# Patient Record
Sex: Female | Born: 1985 | Race: White | Hispanic: No | Marital: Married | Smoking: Never smoker
Health system: Southern US, Community
[De-identification: ages and names within clinical notes are randomized; demographics above are authoritative.]

## PROBLEM LIST (undated history)

## (undated) DIAGNOSIS — R198 Other specified symptoms and signs involving the digestive system and abdomen: Secondary | ICD-10-CM

## (undated) DIAGNOSIS — O24419 Gestational diabetes mellitus in pregnancy, unspecified control: Secondary | ICD-10-CM

## (undated) HISTORY — DX: Gestational diabetes mellitus in pregnancy, unspecified control: O24.419

## (undated) HISTORY — PX: OTHER SURGICAL HISTORY: SHX169

## (undated) HISTORY — DX: Other specified symptoms and signs involving the digestive system and abdomen: R19.8

## (undated) HISTORY — PX: WISDOM TOOTH EXTRACTION: SHX21

---

## 2015-03-12 ENCOUNTER — Encounter: Payer: BLUE CROSS/BLUE SHIELD | Attending: Obstetrics and Gynecology

## 2015-03-12 VITALS — Ht 65.0 in | Wt 169.8 lb

## 2015-03-12 DIAGNOSIS — O24419 Gestational diabetes mellitus in pregnancy, unspecified control: Secondary | ICD-10-CM | POA: Insufficient documentation

## 2015-03-12 DIAGNOSIS — Z713 Dietary counseling and surveillance: Secondary | ICD-10-CM | POA: Insufficient documentation

## 2015-03-18 NOTE — Progress Notes (Signed)
  Patient was seen on 02/09/15 for Gestational Diabetes self-management . The following learning objectives were met by the patient :   States the definition of Gestational Diabetes  States why dietary management is important in controlling blood glucose  Describes the effects of carbohydrates on blood glucose levels  Demonstrates ability to create a balanced meal plan  Demonstrates carbohydrate counting   States when to check blood glucose levels  Demonstrates proper blood glucose monitoring techniques  States the effect of stress and exercise on blood glucose levels  States the importance of limiting caffeine and abstaining from alcohol and smoking  Plan:  Aim for 2 Carb Choices per meal (30 grams) +/- 1 either way for breakfast Aim for 3 Carb Choices per meal (45 grams) +/- 1 either way from lunch and dinner Aim for 1-2 Carbs per snack Begin reading food labels for Total Carbohydrate and sugar grams of foods Consider  increasing your activity level by walking daily as tolerated Begin checking BG before breakfast and 1-2 hours after first bit of breakfast, lunch and dinner after  as directed by MD  Take medication  as directed by MD  Blood glucose monitor given: One Touch Verio Flex Lot # [id number] X Exp: 01/2016  Patient instructed to monitor glucose levels: FBS: 60 - <90 1 hour: <140 2 hour: <120  Patient received the following handouts:  Nutrition Diabetes and Pregnancy  Carbohydrate Counting List  Meal Planning worksheet  Patient will be seen for follow-up as needed.

## 2015-04-21 NOTE — H&P (Signed)
Charlotte Hodge  DICTATION # [id number] CSN# [id number]   Meriel PicaHOLLAND,Charlotte Yonker M, MD 04/21/2015 3:14 PM

## 2015-04-30 NOTE — H&P (Signed)
NAME:  Charlotte Hodge, Charlotte Hodge                   ACCOUNT NO.:  MEDICAL RECORD NO.:  [id number]  LOCATION:                                 FACILITY:  PHYSICIAN:  Duke Salvia. Marcelle Overlie, M.D.    DATE OF BIRTH:  DATE OF ADMISSION: DATE OF DISCHARGE:                             HISTORY & PHYSICAL   CHIEF COMPLAINT:  Breech presentation at term.  HPI:  A 30 year old, G2, P 0-0-1-0, EDD May 21, 2015, presents at 39 weeks for primary C-section for persistent breech presentation.  She has gestational diabetes.  Has had good control with diet.  Early pregnancy screening including an NIPT was normal.  Blood type is A positive.  The procedure of cesarean including specific risks related to bleeding, infection, transfusion of adjacent organ injury, wound infection, phlebitis, all discussed with her which she understands and accepts.  PAST MEDICAL HISTORY:  Please see the [facility] form for details.  PHYSICAL EXAMINATION:  VITAL SIGNS:  Temp 98.2 and blood pressure 102/72. HEENT:  Unremarkable. NECK:  Supple without masses. LUNGS:  Clear. CARDIOVASCULAR:  Regular rate and rhythm without murmurs, rubs or gallops. BREASTS:  Not examined.  Term fundal height.  Breech by Leopold's. Fetal heart rate 150, cervix was closed. EXTREMITIES:  Unremarkable. NEUROLOGIC:  Unremarkable.  IMPRESSION: 1. Breech presentation. 2. Well-controlled gestational diabetes.  PLAN:  Primary low-transverse cesarean section.  Procedure and risks discussed as above.     [name] M. Marcelle Overlie, M.D.     [facility]/MEDQ  D:  04/21/2015  T:  04/22/2015  Job:  [id number]

## 2015-05-12 ENCOUNTER — Encounter (HOSPITAL_COMMUNITY): Payer: Self-pay

## 2015-05-12 ENCOUNTER — Inpatient Hospital Stay (HOSPITAL_COMMUNITY)
Admission: AD | Admit: 2015-05-12 | Discharge: 2015-05-14 | DRG: 766 | Disposition: A | Discharge status: Home / Self Care | Payer: BLUE CROSS/BLUE SHIELD | Source: Ambulatory Visit | Attending: Obstetrics and Gynecology | Admitting: Obstetrics and Gynecology

## 2015-05-12 ENCOUNTER — Encounter (HOSPITAL_COMMUNITY): Payer: Self-pay | Admitting: Anesthesiology

## 2015-05-12 ENCOUNTER — Encounter (HOSPITAL_COMMUNITY)
Admission: AD | Disposition: A | Discharge status: Home / Self Care | Payer: Self-pay | Source: Ambulatory Visit | Attending: Obstetrics and Gynecology

## 2015-05-12 ENCOUNTER — Inpatient Hospital Stay (HOSPITAL_COMMUNITY): Payer: BLUE CROSS/BLUE SHIELD | Admitting: Anesthesiology

## 2015-05-12 DIAGNOSIS — Z98891 History of uterine scar from previous surgery: Secondary | ICD-10-CM

## 2015-05-12 DIAGNOSIS — Z3A38 38 weeks gestation of pregnancy: Secondary | ICD-10-CM

## 2015-05-12 DIAGNOSIS — O321XX0 Maternal care for breech presentation, not applicable or unspecified: Secondary | ICD-10-CM | POA: Diagnosis present

## 2015-05-12 DIAGNOSIS — O24429 Gestational diabetes mellitus in childbirth, unspecified control: Secondary | ICD-10-CM | POA: Diagnosis present

## 2015-05-12 LAB — CBC
HCT: 40.6 % (ref 36.0–46.0)
Hemoglobin: 14 g/dL (ref 12.0–15.0)
MCH: 31.5 pg (ref 26.0–34.0)
MCHC: 34.5 g/dL (ref 30.0–36.0)
MCV: 91.4 fL (ref 78.0–100.0)
PLATELETS: 285 10*3/uL (ref 150–400)
RBC: 4.44 MIL/uL (ref 3.87–5.11)
RDW: 13.9 % (ref 11.5–15.5)
WBC: 11.6 10*3/uL — ABNORMAL HIGH (ref 4.0–10.5)

## 2015-05-12 LAB — TYPE AND SCREEN
ABO/RH(D): A POS
ANTIBODY SCREEN: NEGATIVE

## 2015-05-12 LAB — POCT FERN TEST: POCT FERN TEST: POSITIVE

## 2015-05-12 LAB — GLUCOSE, CAPILLARY: Glucose-Capillary: 76 mg/dL (ref 65–99)

## 2015-05-12 LAB — ABO/RH: ABO/RH(D): A POS

## 2015-05-12 SURGERY — Surgical Case
Anesthesia: Spinal

## 2015-05-12 MED ORDER — OXYCODONE HCL 5 MG/5ML PO SOLN: 5.0000 mg | Freq: Once | ORAL | Status: DC | PRN

## 2015-05-12 MED ORDER — WITCH HAZEL-GLYCERIN EX PADS: 1.0000 "application " | MEDICATED_PAD | CUTANEOUS | Status: DC | PRN

## 2015-05-12 MED ORDER — MENTHOL 3 MG MT LOZG
1.0000 | LOZENGE | OROMUCOSAL | Status: DC | PRN
  Filled 2015-05-12: qty 9

## 2015-05-12 MED ORDER — ACETAMINOPHEN 500 MG PO TABS: 1000.0000 mg | ORAL_TABLET | Freq: Four times a day (QID) | ORAL | Status: DC

## 2015-05-12 MED ORDER — ACETAMINOPHEN 500 MG PO TABS
1000.0000 mg | ORAL_TABLET | Freq: Four times a day (QID) | ORAL | Status: AC
  Administered 2015-05-12 (×2): 1000 mg via ORAL
  Filled 2015-05-12 (×2): qty 2

## 2015-05-12 MED ORDER — OXYTOCIN 10 UNIT/ML IJ SOLN
40.0000 [IU] | INTRAMUSCULAR | Status: DC | PRN
  Administered 2015-05-12: 40 [IU] via INTRAVENOUS

## 2015-05-12 MED ORDER — SCOPOLAMINE 1 MG/3DAYS TD PT72: 1.0000 | MEDICATED_PATCH | Freq: Once | TRANSDERMAL | Status: DC

## 2015-05-12 MED ORDER — LACTATED RINGERS IV SOLN: INTRAVENOUS | Status: DC

## 2015-05-12 MED ORDER — FENTANYL CITRATE (PF) 100 MCG/2ML IJ SOLN
INTRAMUSCULAR | Status: DC | PRN
  Administered 2015-05-12: 20 ug via INTRATHECAL

## 2015-05-12 MED ORDER — LANOLIN HYDROUS EX OINT: 1.0000 "application " | TOPICAL_OINTMENT | CUTANEOUS | Status: DC | PRN

## 2015-05-12 MED ORDER — OXYTOCIN 10 UNIT/ML IJ SOLN
INTRAMUSCULAR | Status: AC
  Filled 2015-05-12: qty 4

## 2015-05-12 MED ORDER — MEPERIDINE HCL 25 MG/ML IJ SOLN: 6.2500 mg | INTRAMUSCULAR | Status: DC | PRN

## 2015-05-12 MED ORDER — ZOLPIDEM TARTRATE 5 MG PO TABS: 5.0000 mg | ORAL_TABLET | Freq: Every evening | ORAL | Status: DC | PRN

## 2015-05-12 MED ORDER — NALBUPHINE HCL 10 MG/ML IJ SOLN: 5.0000 mg | Freq: Once | INTRAMUSCULAR | Status: DC | PRN

## 2015-05-12 MED ORDER — NALBUPHINE HCL 10 MG/ML IJ SOLN: 5.0000 mg | INTRAMUSCULAR | Status: DC | PRN

## 2015-05-12 MED ORDER — NALOXONE HCL 2 MG/2ML IJ SOSY
1.0000 ug/kg/h | PREFILLED_SYRINGE | INTRAVENOUS | Status: DC | PRN
  Filled 2015-05-12: qty 2

## 2015-05-12 MED ORDER — CITRIC ACID-SODIUM CITRATE 334-500 MG/5ML PO SOLN
30.0000 mL | Freq: Once | ORAL | Status: AC
Start: 2015-05-12 — End: 2015-05-12
  Administered 2015-05-12: 30 mL via ORAL
  Filled 2015-05-12: qty 15

## 2015-05-12 MED ORDER — ONDANSETRON HCL 4 MG/2ML IJ SOLN: 4.0000 mg | Freq: Three times a day (TID) | INTRAMUSCULAR | Status: DC | PRN

## 2015-05-12 MED ORDER — ONDANSETRON HCL 4 MG/2ML IJ SOLN
INTRAMUSCULAR | Status: AC
  Filled 2015-05-12: qty 2

## 2015-05-12 MED ORDER — SIMETHICONE 80 MG PO CHEW
80.0000 mg | CHEWABLE_TABLET | ORAL | Status: DC
  Administered 2015-05-12 – 2015-05-14 (×2): 80 mg via ORAL
  Filled 2015-05-12 (×4): qty 1

## 2015-05-12 MED ORDER — KETOROLAC TROMETHAMINE 30 MG/ML IJ SOLN: 30.0000 mg | Freq: Four times a day (QID) | INTRAMUSCULAR | Status: DC | PRN

## 2015-05-12 MED ORDER — PRENATAL MULTIVITAMIN CH
1.0000 | ORAL_TABLET | Freq: Every day | ORAL | Status: DC
  Administered 2015-05-12 – 2015-05-13 (×2): 1 via ORAL
  Filled 2015-05-12 (×5): qty 1

## 2015-05-12 MED ORDER — IBUPROFEN 600 MG PO TABS
600.0000 mg | ORAL_TABLET | Freq: Four times a day (QID) | ORAL | Status: DC
  Administered 2015-05-12 – 2015-05-14 (×6): 600 mg via ORAL
  Filled 2015-05-12 (×7): qty 1

## 2015-05-12 MED ORDER — OXYTOCIN 10 UNIT/ML IJ SOLN: 2.5000 [IU]/h | INTRAVENOUS | Status: AC

## 2015-05-12 MED ORDER — CEFAZOLIN SODIUM-DEXTROSE 2-3 GM-% IV SOLR
INTRAVENOUS | Status: AC
  Filled 2015-05-12: qty 50

## 2015-05-12 MED ORDER — SODIUM CHLORIDE 0.9% FLUSH: 3.0000 mL | INTRAVENOUS | Status: DC | PRN

## 2015-05-12 MED ORDER — SIMETHICONE 80 MG PO CHEW
80.0000 mg | CHEWABLE_TABLET | Freq: Three times a day (TID) | ORAL | Status: DC
  Administered 2015-05-12 – 2015-05-14 (×4): 80 mg via ORAL
  Filled 2015-05-12 (×11): qty 1

## 2015-05-12 MED ORDER — HYDROMORPHONE HCL 1 MG/ML IJ SOLN: 0.2500 mg | INTRAMUSCULAR | Status: DC | PRN

## 2015-05-12 MED ORDER — SCOPOLAMINE 1 MG/3DAYS TD PT72
MEDICATED_PATCH | TRANSDERMAL | Status: DC | PRN
  Administered 2015-05-12: 1 via TRANSDERMAL

## 2015-05-12 MED ORDER — MORPHINE SULFATE (PF) 0.5 MG/ML IJ SOLN
INTRAMUSCULAR | Status: DC | PRN
  Administered 2015-05-12: .4 mg via INTRATHECAL

## 2015-05-12 MED ORDER — DIPHENHYDRAMINE HCL 25 MG PO CAPS
25.0000 mg | ORAL_CAPSULE | Freq: Four times a day (QID) | ORAL | Status: DC | PRN
  Filled 2015-05-12: qty 1

## 2015-05-12 MED ORDER — OXYCODONE HCL 5 MG PO TABS: 5.0000 mg | ORAL_TABLET | Freq: Once | ORAL | Status: DC | PRN

## 2015-05-12 MED ORDER — PHENYLEPHRINE 8 MG IN D5W 100 ML (0.08MG/ML) PREMIX OPTIME
INJECTION | INTRAVENOUS | Status: AC
  Filled 2015-05-12: qty 100

## 2015-05-12 MED ORDER — PHENYLEPHRINE 8 MG IN D5W 100 ML (0.08MG/ML) PREMIX OPTIME
INJECTION | INTRAVENOUS | Status: DC | PRN
  Administered 2015-05-12: 60 ug/min via INTRAVENOUS

## 2015-05-12 MED ORDER — DIPHENHYDRAMINE HCL 50 MG/ML IJ SOLN: 12.5000 mg | INTRAMUSCULAR | Status: DC | PRN

## 2015-05-12 MED ORDER — DIBUCAINE 1 % RE OINT
1.0000 "application " | TOPICAL_OINTMENT | RECTAL | Status: DC | PRN
  Filled 2015-05-12: qty 28

## 2015-05-12 MED ORDER — TETANUS-DIPHTH-ACELL PERTUSSIS 5-2.5-18.5 LF-MCG/0.5 IM SUSP
0.5000 mL | Freq: Once | INTRAMUSCULAR | Status: DC
  Filled 2015-05-12: qty 0.5

## 2015-05-12 MED ORDER — LACTATED RINGERS IV BOLUS (SEPSIS)
1000.0000 mL | Freq: Once | INTRAVENOUS | Status: AC
  Administered 2015-05-12: 1000 mL via INTRAVENOUS

## 2015-05-12 MED ORDER — KETOROLAC TROMETHAMINE 30 MG/ML IJ SOLN
INTRAMUSCULAR | Status: AC
  Filled 2015-05-12: qty 1

## 2015-05-12 MED ORDER — SCOPOLAMINE 1 MG/3DAYS TD PT72
MEDICATED_PATCH | TRANSDERMAL | Status: AC
  Filled 2015-05-12: qty 1

## 2015-05-12 MED ORDER — IBUPROFEN 600 MG PO TABS: 600.0000 mg | ORAL_TABLET | Freq: Four times a day (QID) | ORAL | Status: DC | PRN

## 2015-05-12 MED ORDER — ACETAMINOPHEN 325 MG PO TABS: 650.0000 mg | ORAL_TABLET | ORAL | Status: DC | PRN

## 2015-05-12 MED ORDER — ONDANSETRON HCL 4 MG/2ML IJ SOLN
INTRAMUSCULAR | Status: DC | PRN
  Administered 2015-05-12: 4 mg via INTRAVENOUS

## 2015-05-12 MED ORDER — SENNOSIDES-DOCUSATE SODIUM 8.6-50 MG PO TABS
2.0000 | ORAL_TABLET | ORAL | Status: DC
  Administered 2015-05-12 – 2015-05-14 (×2): 2 via ORAL
  Filled 2015-05-12 (×4): qty 2

## 2015-05-12 MED ORDER — SIMETHICONE 80 MG PO CHEW
80.0000 mg | CHEWABLE_TABLET | ORAL | Status: DC | PRN
  Administered 2015-05-13: 80 mg via ORAL
  Filled 2015-05-12: qty 1

## 2015-05-12 MED ORDER — LACTATED RINGERS IV SOLN
INTRAVENOUS | Status: DC | PRN
  Administered 2015-05-12: 07:00:00 via INTRAVENOUS

## 2015-05-12 MED ORDER — DEXTROSE 5 % IV SOLN: 1.0000 ug/kg/h | INTRAVENOUS | Status: DC | PRN

## 2015-05-12 MED ORDER — IBUPROFEN 600 MG PO TABS
600.0000 mg | ORAL_TABLET | Freq: Four times a day (QID) | ORAL | Status: DC | PRN
  Administered 2015-05-12: 600 mg via ORAL
  Filled 2015-05-12: qty 1

## 2015-05-12 MED ORDER — FAMOTIDINE IN NACL 20-0.9 MG/50ML-% IV SOLN
20.0000 mg | Freq: Once | INTRAVENOUS | Status: AC
Start: 2015-05-12 — End: 2015-05-12
  Administered 2015-05-12: 20 mg via INTRAVENOUS
  Filled 2015-05-12: qty 50

## 2015-05-12 MED ORDER — DIPHENHYDRAMINE HCL 25 MG PO CAPS
25.0000 mg | ORAL_CAPSULE | ORAL | Status: DC | PRN
  Filled 2015-05-12: qty 1

## 2015-05-12 MED ORDER — KETOROLAC TROMETHAMINE 30 MG/ML IJ SOLN
30.0000 mg | Freq: Four times a day (QID) | INTRAMUSCULAR | Status: DC | PRN
  Administered 2015-05-12: 30 mg via INTRAMUSCULAR

## 2015-05-12 MED ORDER — BUPIVACAINE HCL (PF) 0.25 % IJ SOLN
INTRAMUSCULAR | Status: AC
  Filled 2015-05-12: qty 30

## 2015-05-12 MED ORDER — NALOXONE HCL 0.4 MG/ML IJ SOLN: 0.4000 mg | INTRAMUSCULAR | Status: DC | PRN

## 2015-05-12 MED ORDER — LACTATED RINGERS IV SOLN
INTRAVENOUS | Status: DC | PRN
  Administered 2015-05-12 (×3): via INTRAVENOUS

## 2015-05-12 MED ORDER — BUPIVACAINE IN DEXTROSE 0.75-8.25 % IT SOLN
INTRATHECAL | Status: DC | PRN
  Administered 2015-05-12: 10.75 mg via INTRATHECAL

## 2015-05-12 MED ORDER — FENTANYL CITRATE (PF) 100 MCG/2ML IJ SOLN
INTRAMUSCULAR | Status: AC
  Filled 2015-05-12: qty 2

## 2015-05-12 MED ORDER — DIPHENHYDRAMINE HCL 25 MG PO CAPS: 25.0000 mg | ORAL_CAPSULE | ORAL | Status: DC | PRN

## 2015-05-12 MED ORDER — MORPHINE SULFATE (PF) 0.5 MG/ML IJ SOLN
INTRAMUSCULAR | Status: AC
  Filled 2015-05-12: qty 10

## 2015-05-12 MED ORDER — CEFAZOLIN SODIUM-DEXTROSE 2-3 GM-% IV SOLR
2.0000 g | Freq: Three times a day (TID) | INTRAVENOUS | Status: DC
  Administered 2015-05-12: 2 g via INTRAVENOUS

## 2015-05-12 SURGICAL SUPPLY — 35 items
BARRIER ADHS 3X4 INTERCEED (GAUZE/BANDAGES/DRESSINGS) IMPLANT
BENZOIN TINCTURE PRP APPL 2/3 (GAUZE/BANDAGES/DRESSINGS) ×3 IMPLANT
CLAMP CORD UMBIL (MISCELLANEOUS) IMPLANT
CLOSURE WOUND 1/2 X4 (GAUZE/BANDAGES/DRESSINGS) ×1
CLOTH BEACON ORANGE TIMEOUT ST (SAFETY) ×3 IMPLANT
CONTAINER PREFILL 10% NBF 15ML (MISCELLANEOUS) IMPLANT
DRAPE SHEET LG 3/4 BI-LAMINATE (DRAPES) IMPLANT
DRSG OPSITE POSTOP 4X10 (GAUZE/BANDAGES/DRESSINGS) ×3 IMPLANT
DURAPREP 26ML APPLICATOR (WOUND CARE) ×3 IMPLANT
ELECT REM PT RETURN 9FT ADLT (ELECTROSURGICAL) ×3
ELECTRODE REM PT RTRN 9FT ADLT (ELECTROSURGICAL) ×1 IMPLANT
EXTRACTOR VACUUM M CUP 4 TUBE (SUCTIONS) IMPLANT
EXTRACTOR VACUUM M CUP 4' TUBE (SUCTIONS)
GLOVE BIO SURGEON STRL SZ 6.5 (GLOVE) ×2 IMPLANT
GLOVE BIO SURGEONS STRL SZ 6.5 (GLOVE) ×1
GLOVE BIOGEL PI IND STRL 7.0 (GLOVE) ×1 IMPLANT
GLOVE BIOGEL PI INDICATOR 7.0 (GLOVE) ×2
GOWN STRL REUS W/TWL LRG LVL3 (GOWN DISPOSABLE) ×6 IMPLANT
KIT ABG SYR 3ML LUER SLIP (SYRINGE) IMPLANT
NEEDLE HYPO 22GX1.5 SAFETY (NEEDLE) IMPLANT
NEEDLE HYPO 25X5/8 SAFETYGLIDE (NEEDLE) ×3 IMPLANT
NS IRRIG 1000ML POUR BTL (IV SOLUTION) ×3 IMPLANT
PACK C SECTION WH (CUSTOM PROCEDURE TRAY) ×3 IMPLANT
PAD OB MATERNITY 4.3X12.25 (PERSONAL CARE ITEMS) ×3 IMPLANT
PENCIL SMOKE EVAC W/HOLSTER (ELECTROSURGICAL) ×3 IMPLANT
STRIP CLOSURE SKIN 1/2X4 (GAUZE/BANDAGES/DRESSINGS) ×2 IMPLANT
SUT CHROMIC 0 CTX 36 (SUTURE) ×6 IMPLANT
SUT PLAIN 0 NONE (SUTURE) IMPLANT
SUT PLAIN 2 0 XLH (SUTURE) IMPLANT
SUT VIC AB 0 CT1 27 (SUTURE) ×6
SUT VIC AB 0 CT1 27XBRD ANBCTR (SUTURE) ×3 IMPLANT
SUT VIC AB 4-0 KS 27 (SUTURE) ×3 IMPLANT
SYR CONTROL 10ML LL (SYRINGE) IMPLANT
TOWEL OR 17X24 6PK STRL BLUE (TOWEL DISPOSABLE) ×3 IMPLANT
TRAY FOLEY CATH SILVER 14FR (SET/KITS/TRAYS/PACK) ×3 IMPLANT

## 2015-05-12 NOTE — Op Note (Signed)
Charlotte Hodge, Charlotte Hodge              ACCOUNT NO.:  [id number]  MEDICAL RECORD NO.:  [id number]  LOCATION:  [facility]                          FACILITY:  [facility]  PHYSICIAN:  Cherry Wittwer L. Willa Brocks, M.D.DATE OF BIRTH:  October 01, 1985  DATE OF PROCEDURE:  05/12/2015 DATE OF DISCHARGE:                              OPERATIVE REPORT   PREOPERATIVE DIAGNOSIS:  Intrauterine pregnancy at 38 weeks and 5 days, spontaneous rupture of membranes and breech presentation.  POSTOPERATIVE DIAGNOSES:  Intrauterine pregnancy at 38 weeks and 5 days, spontaneous rupture of membranes, and breech presentation.  PROCEDURE:  Primary low transverse cesarean section.  SURGEON:  Bliss Tsang L. Kalynne Womac, MD  ANESTHESIA:  Spinal.  EBL:  500 mL.  COMPLICATIONS:  None.  DRAINS:  Foley.  PROCEDURE IN DETAIL:  Patient was taken to the operating room.  Her spinal was placed and found to be adequate.  She was prepped and draped in usual sterile fashion.  Foley catheter was inserted.  A low transverse incision was made, carried down to the fascia.  The fascia was scored in the midline and extended laterally.  The rectus muscles were separated in midline.  The peritoneum was entered bluntly.  The peritoneal incision was then stretched.  The bladder flap was inserted and the bladder flap was developed using sharp and blunt dissection.  A low transverse incision was made in the uterus.  Amniotic fluid was clear.  The baby was in frank breech presentation, was delivered easily, was a female infant, Apgars 9 and 9.  The cord was clamped and cut.  The baby was handed to the neonatal team.  The uterus was exteriorized after the placenta was manually removed, noted to be normal intact with a three-vessel cord.  The uterus was cleared of all clots and debris.  It was closed in 1 layer using 0 chromic in a running, locked stitch. Hemostasis was very good.  The uterus was returned to the abdomen. Irrigation was performed.  Hemostasis was  again noted.  The peritoneum was closed using 0 Vicryl.  The fascia was closed using 0 Vicryl starting each corner meeting in the midline. After irrigation of subcutaneous layer, the skin was closed with a 4-0 Vicryl on a Keith needle.  Steri-Strips and dressing were applied.  All sponge, lap, and instrument counts were correct x2.  Patient went to recovery room in stable condition.     Chyanna Flock L. Vincente Poli, M.D.     Florestine Avers  D:  05/12/2015  T:  05/12/2015  Job:  [id number]

## 2015-05-12 NOTE — MAU Note (Signed)
Water broke at 0405-clear fluid. Contractions 3-5 mins. Rates 5/10. +FM. Denies vag bleeding. Cervix was 1cm last week.

## 2015-05-12 NOTE — Lactation Note (Signed)
This note was copied from the chart of [name]. Lactation Consultation Note  Baby is 3 HOL and has eaten 3 times per her parents.  She was asleep at this consult.  Hand expression taught with colostrum visible.  Explained to mother the need to has latch assessment every 8 hours.  Information given on support groups and outpatient services.  Patient Name: [name] Date: 05/12/2015 Reason for consult: Initial assessment   Maternal Data Has patient been taught Hand Expression?: Yes Does the patient have breastfeeding experience prior to this delivery?: No  Feeding Feeding Type: Breast Fed Length of feed: 10 min  LATCH Score/Interventions Latch: Grasps breast easily, tongue down, lips flanged, rhythmical sucking.  Audible Swallowing: A few with stimulation Intervention(s): Skin to skin;Hand expression  Type of Nipple: Everted at rest and after stimulation  Comfort (Breast/Nipple): Soft / non-tender     Hold (Positioning): Assistance needed to correctly position infant at breast and maintain latch.  LATCH Score: 8  Lactation Tools Discussed/Used     Consult Status Consult Status: Follow-up    Soyla Dryer 05/12/2015, 10:23 AM

## 2015-05-12 NOTE — Anesthesia Procedure Notes (Addendum)
Spinal Patient location during procedure: OR  Spinal Patient location during procedure: OR Start time: 05/12/2015 6:13 AM Preanesthetic Checklist Completed: patient identified, site marked, surgical consent, pre-op evaluation, timeout performed, IV checked, risks and benefits discussed and monitors and equipment checked Spinal Block Patient position: sitting Prep: DuraPrep Patient monitoring: blood pressure, continuous pulse ox, cardiac monitor and heart rate Approach: midline Location: L3-4 Injection technique: single-shot Needle Needle type: Pencan  Needle gauge: 24 G Needle length: 9 cm Needle insertion depth: 6 cm Assessment Sensory level: T6 Additional Notes Pt difficult to block for dental procedures. Greater than 15 minutes were required to obtain an adequate level.

## 2015-05-12 NOTE — Transfer of Care (Signed)
Immediate Anesthesia Transfer of Care Note  Patient: Charlotte Hodge  Procedure(s) Performed: Procedure(s): CESAREAN SECTION (N/A)  Patient Location: PACU  Anesthesia Type:Spinal  Level of Consciousness: awake and alert   Airway & Oxygen Therapy: Patient Spontanous Breathing  Post-op Assessment: Report given to RN and Post -op Vital signs reviewed and stable  Post vital signs: Reviewed and stable  Last Vitals:  Filed Vitals:   05/12/15 0446  BP: 136/84  Pulse: 101  Temp: 36.8 C  Resp: 18    Complications: No apparent anesthesia complications

## 2015-05-12 NOTE — Consult Note (Signed)
[facility]  Delivery Note:  C-section       05/12/2015  6:57 AM  I was called to the operating room at the request of the patient's obstetrician (Dr. Vincente Poli) for a primary c-section for breech presentation.  PRENATAL HX:  This is a 30 y/o G1P0 at 28 and 5/[redacted] weeks gestation who was admitted this morning for SROM at 0430 (ROM ~ 2 hours).  Pregnancy complicated by GDM (diet controlled) and breech presentation.    DELIVERY:  Infant was vigorous at delivery, requiring no resuscitation other than standard warming, drying and stimulation.  APGARs 8 and 9.  Exam notable for external rotation of the hips and a prominent occiput, as expected with breech presentation, but otherwise was within normal limits.  After 5 minutes, baby left with nurse to assist parents with skin-to-skin care.   _____________________ Electronically Signed By: Maryan Char, MD Neonatologist

## 2015-05-12 NOTE — Brief Op Note (Signed)
05/12/2015  6:56 AM  PATIENT:  Charlotte Hodge  30 y.o. female  PRE-OPERATIVE DIAGNOSIS:   IUP at 38 w 5 days SROM Breech  POST-OPERATIVE DIAGNOSIS:   Same  PROCEDURE:  Procedure(s): CESAREAN SECTION (N/A)  SURGEON:  Surgeon(s) and Role:    * Marcelle Overlie, MD - Primary  PHYSICIAN ASSISTANT:   ASSISTANTS: none   ANESTHESIA:   spinal  EBL:  Total I/O In: 2500 [I.V.:2500] Out: 700 [Urine:200; Blood:500]  BLOOD ADMINISTERED:none  DRAINS: Urinary Catheter (Foley)   LOCAL MEDICATIONS USED:  NONE  SPECIMEN:  No Specimen  DISPOSITION OF SPECIMEN:  N/A  COUNTS:  YES  TOURNIQUET:  * No tourniquets in log *  DICTATION: .Other Dictation: Dictation Number [id number]  PLAN OF CARE: Admit to inpatient   PATIENT DISPOSITION:  PACU - hemodynamically stable.   Delay start of Pharmacological VTE agent (>24hrs) due to surgical blood loss or risk of bleeding: not applicable

## 2015-05-12 NOTE — Anesthesia Postprocedure Evaluation (Signed)
Anesthesia Post Note  Patient: Web designer  Procedure(s) Performed: Procedure(s) (LRB): CESAREAN SECTION (N/A)  Patient location during evaluation: Mother Baby Anesthesia Type: Spinal Level of consciousness: awake Pain management: satisfactory to patient Vital Signs Assessment: post-procedure vital signs reviewed and stable Respiratory status: spontaneous breathing Cardiovascular status: stable Anesthetic complications: no    Last Vitals:  Filed Vitals:   05/12/15 1030 05/12/15 1130  BP: 122/68 122/68  Pulse: 68 65  Temp:    Resp: 18 18    Last Pain:  Filed Vitals:   05/12/15 1248  PainSc: 4                  Jaki Steptoe

## 2015-05-12 NOTE — Anesthesia Preprocedure Evaluation (Signed)
Anesthesia Evaluation  Patient identified by MRN, date of birth, ID band Patient awake    Reviewed: Allergy & Precautions, NPO status , Patient's Chart, lab work & pertinent test results  Airway Mallampati: I  TM Distance: >3 FB Neck ROM: Full    Dental  (+) Teeth Intact, Dental Advisory Given   Pulmonary    breath sounds clear to auscultation       Cardiovascular  Rhythm:Regular Rate:Normal     Neuro/Psych    GI/Hepatic   Endo/Other  diabetes, Well Controlled, Gestational  Renal/GU      Musculoskeletal   Abdominal   Peds  Hematology   Anesthesia Other Findings   Reproductive/Obstetrics (+) Pregnancy                             Anesthesia Physical Anesthesia Plan  ASA: II and emergent  Anesthesia Plan: Spinal   Post-op Pain Management:    Induction:   Airway Management Planned: Natural Airway  Additional Equipment:   Intra-op Plan:   Post-operative Plan:   Informed Consent: I have reviewed the patients History and Physical, chart, labs and discussed the procedure including the risks, benefits and alternatives for the proposed anesthesia with the patient or authorized representative who has indicated his/her understanding and acceptance.   Dental advisory given  Plan Discussed with: Anesthesiologist, CRNA and Surgeon  Anesthesia Plan Comments:         Anesthesia Quick Evaluation

## 2015-05-12 NOTE — Anesthesia Postprocedure Evaluation (Signed)
Anesthesia Post Note  Patient: Web designer  Procedure(s) Performed: Procedure(s) (LRB): CESAREAN SECTION (N/A)  Patient location during evaluation: Nursing Unit Anesthesia Type: Spinal Level of consciousness: awake, awake and alert and oriented Pain management: pain level controlled Vital Signs Assessment: post-procedure vital signs reviewed and stable Respiratory status: spontaneous breathing and nonlabored ventilation Cardiovascular status: blood pressure returned to baseline Postop Assessment: no headache Anesthetic complications: no    Last Vitals:  Filed Vitals:   05/12/15 1521 05/12/15 2059  BP: 100/68 101/55  Pulse: 86 70  Temp:  37.5 C  Resp: 18 18    Last Pain:  Filed Vitals:   05/12/15 2101  PainSc: 5                  Charlotte Hodge A

## 2015-05-12 NOTE — H&P (Signed)
Charlotte Hodge is a 30 y.o. G 1 P0 at 77 w 5 days with Breech Presentation presents with SROM at 0430. Maternal Medical History:  Reason for admission: Rupture of membranes.   Fetal activity: Perceived fetal activity is normal.      OB History    Gravida Para Term Preterm AB TAB SAB Ectopic Multiple Living   1              Past Medical History  Diagnosis Date  . Gestational diabetes mellitus (GDM), antepartum   . GI problem    Past Surgical History  Procedure Laterality Date  . None     Family History: family history is not on file. Social History:  has no tobacco, alcohol, and drug history on file.   Prenatal Transfer Tool  Maternal Diabetes: No Genetic Screening: Normal Maternal Ultrasounds/Referrals: Normal Fetal Ultrasounds or other Referrals:  None Maternal Substance Abuse:  No Significant Maternal Medications:  None Significant Maternal Lab Results:  None Other Comments:  None  Review of Systems  All other systems reviewed and are negative.   Dilation: 3 Effacement (%): 90 Station: -2 Exam by:: D. Simpson RN Blood pressure 136/84, pulse 101, temperature 98.2 F (36.8 C), temperature source Oral, resp. rate 18, height  (1.626 m), weight 164 lb (74.39 kg), SpO2 98 %. Maternal Exam:  Abdomen: Fetal presentation: breech     Fetal Exam Fetal State Assessment: Category I - tracings are normal.     Physical Exam  Nursing note and vitals reviewed. Constitutional: She appears well-developed and well-nourished.  HENT:  Head: Normocephalic.  Eyes: Pupils are equal, round, and reactive to light.  Neck: Normal range of motion.  Cardiovascular: Normal rate and regular rhythm.   Respiratory: Effort normal.    Prenatal labs: ABO, Rh:   Antibody:   Rubella:   RPR:    HBsAg:    HIV:    GBS:     Assessment/Plan: IUP at 38 w 5 days Breech SROM Proceed with Primary LTCS OR Notified  Kendall Justo L 05/12/2015, 5:34 AM

## 2015-05-13 ENCOUNTER — Inpatient Hospital Stay (HOSPITAL_COMMUNITY): Admission: RE | Admit: 2015-05-13 | Payer: BLUE CROSS/BLUE SHIELD | Source: Ambulatory Visit

## 2015-05-13 ENCOUNTER — Encounter (HOSPITAL_COMMUNITY): Payer: Self-pay | Admitting: *Deleted

## 2015-05-13 LAB — CBC
HCT: 36.3 % (ref 36.0–46.0)
HEMOGLOBIN: 12.3 g/dL (ref 12.0–15.0)
MCH: 31.1 pg (ref 26.0–34.0)
MCHC: 33.9 g/dL (ref 30.0–36.0)
MCV: 91.9 fL (ref 78.0–100.0)
PLATELETS: 265 10*3/uL (ref 150–400)
RBC: 3.95 MIL/uL (ref 3.87–5.11)
RDW: 14 % (ref 11.5–15.5)
WBC: 11.5 10*3/uL — AB (ref 4.0–10.5)

## 2015-05-13 LAB — BIRTH TISSUE RECOVERY COLLECTION (PLACENTA DONATION)

## 2015-05-13 LAB — RPR: RPR Ser Ql: NONREACTIVE

## 2015-05-13 NOTE — Progress Notes (Signed)
Subjective: Postpartum Day 1: Cesarean Delivery Patient reports tolerating PO, + flatus and no problems voiding.    Objective: Vital signs in last 24 hours: Temp:  [98.1 F (36.7 C)-99.5 F (37.5 C)] 98.1 F (36.7 C) (01/31 0540) Pulse Rate:  [65-86] 75 (01/31 0540) Resp:  [16-18] 18 (01/31 0540) BP: (100-123)/(53-74) 110/66 mmHg (01/31 0540) SpO2:  [97 %-99 %] 99 % (01/31 0334)  Physical Exam:  General: alert and cooperative Lochia: appropriate Uterine Fundus: firm Incision: healing well DVT Evaluation: No evidence of DVT seen on physical exam. Negative Homan's sign. No cords or calf tenderness. No significant calf/ankle edema.   Recent Labs  05/12/15 0525 05/13/15 0528  HGB 14.0 12.3  HCT 40.6 36.3    Assessment/Plan: Status post Cesarean section. Doing well postoperatively.  Continue current care.  CURTIS,CAROL G 05/13/2015, 8:50 AM

## 2015-05-13 NOTE — Clinical Social Work Maternal (Signed)
CLINICAL SOCIAL WORK MATERNAL/CHILD NOTE  Patient Details  Name: Charlotte Hodge MRN: [id number] Date of Birth: [birth year]  Date:  05/13/2015  Clinical Social Worker Initiating Note:  Loleta Books MSW, LCSW Date/ Time Initiated:  05/13/15/0940     Child's Name:  Charlotte Hodge    Legal Guardian:  Dorene Grebe and Jonny Ruiz Mabe  Need for Interpreter:  None   Date of Referral:  05/12/15     Reason for Referral:  History of generalized anxiety  Referral Source:  [facility]   Address:  [address] [place], [place] [address]  Phone number:  [phone]   Household Members:  Spouse   Natural Supports (not living in the home):  Immediate Family, Extended Family   Professional Supports: None   Employment: Full-time   Type of Work: Environmental manager   Education:      Architect:  Media planner   Other Resources:      Cultural/Religious Considerations Which May Impact Care:  None reported  Strengths:  Ability to meet basic needs , Home prepared for child , Pediatrician chosen    Risk Factors/Current Problems:   1. Mental Health Concerns: MOB presents with a history of generalized anxiety since childhood. She denied increase in anxiety during the pregnancy, and shared belief that anxiety is often situational to her work.      Cognitive State:  Able to Concentrate , Alert , Goal Oriented , Linear Thinking , Insightful    Mood/Affect:  Happy , Interested , Calm , Comfortable    CSW Assessment:  CSW received request for consult due to MOB presenting with a history of generalized anxiety disorder. MOB was alone in her room, and presented as receptive to the visit. She was easily engaged, displayed a full range in affect, and presented in a pleasant mood.  MOB was observed to be smiling and caring for the infant during the entire assessment.  Per MOB, she is happy and excited secondary to the birth of the infant. She stated that she is content with her C-section, and  shared that she is recovering well. MOB reported that she did not want to have a strict birth plan since she knew that the child's birth could be outside of her control. She shared that she is happy that the infant is healthy, and that breastfeeding is going well thus far. MOB shared that she is mindful of the need to slowly adjust and learn how to breastfeed, and recognized the innate difficulties and challenges that she may face with feedings since it is new for both her and the infant.  MOB reported looking forward to going home, and stated that she has a strong support system. She shared that she knows that she is not alone, and reported that she is able to accept help from her support system. MOB reported that she also has a supportive employer, and is beginning to explore options in order to work from home more.    MOB reported history of generalized anxiety since childhood, and shared that she has "always" had anxiety. MOB stated that she has never been formally diagnosed, but reported that she experiences increase in anxiety in work settings. 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  She reported that she has learned to step back and realize that feedback from her boss is not personal.    MOB denied increase in anxiety during the pregnancy. She stated that she experienced normative range of worries about her  transition postpartum and then back to work; however, she denied belief that it negatively impacted her sleep, or caused negative/unwanted outcomes. MOB denied increase in lability or irritation as a result of her anxieties. MOB denied depressive symptoms during the pregnancy, and shared that her predominant mood was happiness. MOB presented as receptive and appreciative of education on perinatal mood disorders. CSW highlighted the differences between the baby blues and perinatal mood disorders.  CSW continued to provide education on the role of hormones,  and MOB verbalized awareness of need to monitor her mood now and then again when breastfeeding is discontinued. MOB was receptive to exploring sleep hygiene and self-care activities to support her mental health.  MOB agreed to follow up with her medical provider if she notes onset of symptoms.   MOB denied questions, concerns, or needs at this time. She acknowledged ongoing availability of CSW, and agreed to contact CSW if additional needs arise.  CSW Plan/Description:   1. Patient/Family Education-- Perinatal mood and anxiety disorders 2. Information/Referral to [facility]-- Perinatal mood disorder online resources, organized support group 3. No Further Intervention Required/No Barriers to Discharge    Kelby Fam 05/13/2015, 10:38 AM

## 2015-05-13 NOTE — Lactation Note (Signed)
This note was copied from the chart of Charlotte Bethanne Mule. Lactation Consultation Note  Patient Name: Charlotte Hodge [id number] Date: 05/13/2015 Reason for consult: Follow-up assessment Baby at 34 hr of life and mom is reporting sore nipples. She does have bilateral bruising to the nipple tip and a slight crack on the R nipple. Given comfort gels. Baby has a recessed chin, thick, tight upper labial frenulum that has a slightly notched insertion point. Baby does extend tongue over gum ridge, lifts tongue past midline, and has good lateralization. Baby will extend tongue and has a nice peristolic movement for 5-6 sucks then pulls tongue in and bites down. Baby does have a noticeable tight posterior lingual frenulum. Encouraged mom to pull baby in closer to the breast when feeding and be sure that the top lip is curled out. Suggested that she call at next feeding for latch assessment. Mom is aware of OP services and support group. Discussed baby behavior, feeding frequency, baby belly size, voids, wt loss, breast changes, and nipple care.  The baby's birth wt was changed from 7lb14oz to 7lb12oz so the baby did not have a 5% wt loss in 24 hr.    Maternal Data    Feeding Feeding Type: Breast Fed Length of feed: 10 min  LATCH Score/Interventions                      Lactation Tools Discussed/Used     Consult Status Consult Status: Follow-up Date: 05/14/15 Follow-up type: In-patient    Charlotte Hodge 05/13/2015, 4:51 PM

## 2015-05-14 MED ORDER — IBUPROFEN 600 MG PO TABS: 600.0000 mg | ORAL_TABLET | Freq: Four times a day (QID) | ORAL | Status: DC | PRN

## 2015-05-14 MED ORDER — PRENATAL 27-0.8 MG PO TABS: 1.0000 | ORAL_TABLET | Freq: Every day | ORAL | Status: AC

## 2015-05-14 NOTE — Discharge Summary (Signed)
Obstetric Discharge Summary Reason for Admission: rupture of membranes and breech presentation Prenatal Procedures: ultrasound Intrapartum Procedures: cesarean: low cervical, transverse Postpartum Procedures: none Complications-Operative and Postpartum: none HEMOGLOBIN  Date Value Ref Range Status  05/13/2015 12.3 12.0 - 15.0 g/dL Final   HCT  Date Value Ref Range Status  05/13/2015 36.3 36.0 - 46.0 % Final    Physical Exam:  General: alert and cooperative Lochia: appropriate Uterine Fundus: firm Incision: healing well DVT Evaluation: No evidence of DVT seen on physical exam. Negative Homan's sign. No cords or calf tenderness. No significant calf/ankle edema.  Discharge Diagnoses: Term Pregnancy-delivered  Discharge Information: Date: 05/14/2015 Activity: pelvic rest Diet: routine Medications: PNV and Ibuprofen Condition: stable Instructions: refer to practice specific booklet Discharge to: home   Newborn Data: Live born female  Birth Weight: 7 lb 12 oz (3515 g) APGAR: 8, 9  Home with mother.  Charlotte Hodge G 05/14/2015, 8:27 AM

## 2015-05-15 ENCOUNTER — Inpatient Hospital Stay (HOSPITAL_COMMUNITY)
Admission: AD | Admit: 2015-05-15 | Payer: BLUE CROSS/BLUE SHIELD | Source: Ambulatory Visit | Admitting: Obstetrics and Gynecology

## 2015-05-15 SURGERY — Surgical Case
Anesthesia: Regional

## 2018-08-21 LAB — OB RESULTS CONSOLE ANTIBODY SCREEN: Antibody Screen: NEGATIVE

## 2018-08-21 LAB — OB RESULTS CONSOLE GC/CHLAMYDIA
Chlamydia: NEGATIVE
Gonorrhea: NEGATIVE

## 2018-08-21 LAB — OB RESULTS CONSOLE ABO/RH: RH Type: POSITIVE

## 2018-08-21 LAB — OB RESULTS CONSOLE HEPATITIS B SURFACE ANTIGEN: Hepatitis B Surface Ag: NEGATIVE

## 2018-08-21 LAB — OB RESULTS CONSOLE RUBELLA ANTIBODY, IGM: Rubella: IMMUNE

## 2018-08-21 LAB — OB RESULTS CONSOLE HIV ANTIBODY (ROUTINE TESTING): HIV: NONREACTIVE

## 2018-08-21 LAB — OB RESULTS CONSOLE RPR: RPR: NONREACTIVE

## 2019-03-06 ENCOUNTER — Encounter (HOSPITAL_COMMUNITY): Payer: Self-pay | Admitting: *Deleted

## 2019-03-06 NOTE — Patient Instructions (Signed)
Kennie Karapetian  03/06/2019   Your procedure is scheduled on:  03/22/2019  Arrive at [place] at Entrance C on [address] at [facility]  and [employer]. You are invited to use the FREE valet parking or use the Visitor's parking deck.  Pick up the phone at the desk and dial [phone].  Call this number if you have problems the morning of surgery: [phone]  Remember:   Do not eat food:(After Midnight) Desps de medianoche.  Do not drink clear liquids: (After Midnight) Desps de medianoche.  Take these medicines the morning of surgery with A SIP OF WATER:  none   Do not wear jewelry, make-up or nail polish.  Do not wear lotions, powders, or perfumes. Do not wear deodorant.  Do not shave 48 hours prior to surgery.  Do not bring valuables to the hospital.  [facility] is not   responsible for any belongings or valuables brought to the hospital.  Contacts, dentures or bridgework may not be worn into surgery.  Leave suitcase in the car. After surgery it may be brought to your room.  For patients admitted to the hospital, checkout time is 11:00 AM the day of              discharge.      Please read over the following fact sheets that you were given:     Preparing for Surgery

## 2019-03-07 ENCOUNTER — Telehealth (HOSPITAL_COMMUNITY): Payer: Self-pay | Admitting: *Deleted

## 2019-03-07 NOTE — Telephone Encounter (Signed)
Preadmission screen  

## 2019-03-15 ENCOUNTER — Encounter (HOSPITAL_COMMUNITY): Payer: Self-pay

## 2019-03-15 ENCOUNTER — Telehealth (HOSPITAL_COMMUNITY): Payer: Self-pay | Admitting: *Deleted

## 2019-03-15 NOTE — Telephone Encounter (Signed)
Preadmission screen  

## 2019-03-18 ENCOUNTER — Inpatient Hospital Stay (HOSPITAL_COMMUNITY): Payer: Self-pay | Admitting: Anesthesiology

## 2019-03-18 ENCOUNTER — Inpatient Hospital Stay (HOSPITAL_COMMUNITY)
Admission: AD | Admit: 2019-03-18 | Discharge: 2019-03-19 | DRG: 788 | Disposition: A | Discharge status: Home / Self Care | Payer: Self-pay | Attending: Obstetrics and Gynecology | Admitting: Obstetrics and Gynecology

## 2019-03-18 ENCOUNTER — Encounter (HOSPITAL_COMMUNITY): Payer: Self-pay

## 2019-03-18 ENCOUNTER — Encounter (HOSPITAL_COMMUNITY)
Admission: AD | Disposition: A | Discharge status: Home / Self Care | Payer: Self-pay | Source: Home / Self Care | Attending: Obstetrics and Gynecology

## 2019-03-18 DIAGNOSIS — Z3A38 38 weeks gestation of pregnancy: Secondary | ICD-10-CM

## 2019-03-18 DIAGNOSIS — O99824 Streptococcus B carrier state complicating childbirth: Secondary | ICD-10-CM | POA: Diagnosis present

## 2019-03-18 DIAGNOSIS — O34211 Maternal care for low transverse scar from previous cesarean delivery: Principal | ICD-10-CM | POA: Diagnosis present

## 2019-03-18 DIAGNOSIS — Z20828 Contact with and (suspected) exposure to other viral communicable diseases: Secondary | ICD-10-CM | POA: Diagnosis present

## 2019-03-18 HISTORY — DX: Gestational diabetes mellitus in pregnancy, unspecified control: O24.419

## 2019-03-18 LAB — CBC
HCT: 38.8 % (ref 36.0–46.0)
HCT: 34.6 % — ABNORMAL LOW (ref 36.0–46.0)
Hemoglobin: 13.2 g/dL (ref 12.0–15.0)
Hemoglobin: 11.8 g/dL — ABNORMAL LOW (ref 12.0–15.0)
MCH: 31.9 pg (ref 26.0–34.0)
MCH: 31.7 pg (ref 26.0–34.0)
MCHC: 34 g/dL (ref 30.0–36.0)
MCHC: 34.1 g/dL (ref 30.0–36.0)
MCV: 93.7 fL (ref 80.0–100.0)
MCV: 93 fL (ref 80.0–100.0)
Platelets: 265 10*3/uL (ref 150–400)
Platelets: 249 10*3/uL (ref 150–400)
RBC: 4.14 MIL/uL (ref 3.87–5.11)
RBC: 3.72 MIL/uL — ABNORMAL LOW (ref 3.87–5.11)
RDW: 13.4 % (ref 11.5–15.5)
RDW: 13.3 % (ref 11.5–15.5)
WBC: 11.9 10*3/uL — ABNORMAL HIGH (ref 4.0–10.5)
WBC: 21.6 10*3/uL — ABNORMAL HIGH (ref 4.0–10.5)
nRBC: 0 % (ref 0.0–0.2)
nRBC: 0 % (ref 0.0–0.2)

## 2019-03-18 LAB — TYPE AND SCREEN
ABO/RH(D): A POS
Antibody Screen: NEGATIVE

## 2019-03-18 LAB — POCT FERN TEST: POCT Fern Test: POSITIVE

## 2019-03-18 LAB — RPR: RPR Ser Ql: NONREACTIVE

## 2019-03-18 LAB — GLUCOSE, CAPILLARY
Glucose-Capillary: 104 mg/dL — ABNORMAL HIGH (ref 70–99)
Glucose-Capillary: 111 mg/dL — ABNORMAL HIGH (ref 70–99)

## 2019-03-18 LAB — SARS CORONAVIRUS 2 BY RT PCR (HOSPITAL ORDER, PERFORMED IN ~~LOC~~ HOSPITAL LAB): SARS Coronavirus 2: NEGATIVE

## 2019-03-18 LAB — ABO/RH: ABO/RH(D): A POS

## 2019-03-18 SURGERY — Surgical Case
Anesthesia: Spinal

## 2019-03-18 MED ORDER — HYDROMORPHONE HCL 1 MG/ML IJ SOLN: 0.2500 mg | INTRAMUSCULAR | Status: DC | PRN

## 2019-03-18 MED ORDER — FENTANYL CITRATE (PF) 100 MCG/2ML IJ SOLN
INTRAMUSCULAR | Status: DC | PRN
  Administered 2019-03-18: 15 ug via INTRATHECAL

## 2019-03-18 MED ORDER — KETOROLAC TROMETHAMINE 30 MG/ML IJ SOLN
30.0000 mg | Freq: Once | INTRAMUSCULAR | Status: AC | PRN
  Administered 2019-03-18: 30 mg via INTRAVENOUS

## 2019-03-18 MED ORDER — MEASLES, MUMPS & RUBELLA VAC IJ SOLR: 0.5000 mL | Freq: Once | INTRAMUSCULAR | Status: DC

## 2019-03-18 MED ORDER — OXYCODONE HCL 5 MG PO TABS: 5.0000 mg | ORAL_TABLET | ORAL | Status: DC | PRN

## 2019-03-18 MED ORDER — SODIUM CHLORIDE 0.9 % IV SOLN
INTRAVENOUS | Status: DC | PRN
  Administered 2019-03-18: 04:00:00 via INTRAVENOUS

## 2019-03-18 MED ORDER — OXYTOCIN 40 UNITS IN NORMAL SALINE INFUSION - SIMPLE MED: 2.5000 [IU]/h | INTRAVENOUS | Status: AC

## 2019-03-18 MED ORDER — OXYCODONE HCL 5 MG/5ML PO SOLN: 5.0000 mg | Freq: Once | ORAL | Status: DC | PRN

## 2019-03-18 MED ORDER — DEXAMETHASONE SODIUM PHOSPHATE 4 MG/ML IJ SOLN
INTRAMUSCULAR | Status: AC
  Filled 2019-03-18: qty 1

## 2019-03-18 MED ORDER — SODIUM CHLORIDE 0.9 % IR SOLN
Status: DC | PRN
  Administered 2019-03-18: 1

## 2019-03-18 MED ORDER — NALBUPHINE HCL 10 MG/ML IJ SOLN: 5.0000 mg | INTRAMUSCULAR | Status: DC | PRN

## 2019-03-18 MED ORDER — SIMETHICONE 80 MG PO CHEW: 80.0000 mg | CHEWABLE_TABLET | ORAL | Status: DC | PRN

## 2019-03-18 MED ORDER — IBUPROFEN 800 MG PO TABS
800.0000 mg | ORAL_TABLET | Freq: Three times a day (TID) | ORAL | Status: DC
  Administered 2019-03-18 – 2019-03-19 (×4): 800 mg via ORAL
  Filled 2019-03-18 (×5): qty 1

## 2019-03-18 MED ORDER — KETOROLAC TROMETHAMINE 30 MG/ML IJ SOLN
INTRAMUSCULAR | Status: AC
  Filled 2019-03-18: qty 1

## 2019-03-18 MED ORDER — OXYCODONE HCL 5 MG PO TABS: 5.0000 mg | ORAL_TABLET | Freq: Once | ORAL | Status: DC | PRN

## 2019-03-18 MED ORDER — WITCH HAZEL-GLYCERIN EX PADS: 1.0000 "application " | MEDICATED_PAD | CUTANEOUS | Status: DC | PRN

## 2019-03-18 MED ORDER — MENTHOL 3 MG MT LOZG: 1.0000 | LOZENGE | OROMUCOSAL | Status: DC | PRN

## 2019-03-18 MED ORDER — ONDANSETRON HCL 4 MG/2ML IJ SOLN: 4.0000 mg | Freq: Once | INTRAMUSCULAR | Status: DC | PRN

## 2019-03-18 MED ORDER — COCONUT OIL OIL: 1.0000 "application " | TOPICAL_OIL | Status: DC | PRN

## 2019-03-18 MED ORDER — SIMETHICONE 80 MG PO CHEW
80.0000 mg | CHEWABLE_TABLET | Freq: Three times a day (TID) | ORAL | Status: DC
  Administered 2019-03-18 (×3): 80 mg via ORAL
  Filled 2019-03-18 (×4): qty 1

## 2019-03-18 MED ORDER — SOD CITRATE-CITRIC ACID 500-334 MG/5ML PO SOLN
30.0000 mL | Freq: Once | ORAL | Status: DC
  Filled 2019-03-18: qty 30

## 2019-03-18 MED ORDER — PRENATAL MULTIVITAMIN CH
1.0000 | ORAL_TABLET | Freq: Every day | ORAL | Status: DC
  Administered 2019-03-18 – 2019-03-19 (×2): 1 via ORAL
  Filled 2019-03-18 (×2): qty 1

## 2019-03-18 MED ORDER — OXYTOCIN 40 UNITS IN NORMAL SALINE INFUSION - SIMPLE MED
INTRAVENOUS | Status: DC | PRN
  Administered 2019-03-18: 40 [IU] via INTRAVENOUS

## 2019-03-18 MED ORDER — MORPHINE SULFATE (PF) 0.5 MG/ML IJ SOLN
INTRAMUSCULAR | Status: DC | PRN
  Administered 2019-03-18: .15 mg via INTRATHECAL

## 2019-03-18 MED ORDER — DEXAMETHASONE SODIUM PHOSPHATE 4 MG/ML IJ SOLN
INTRAMUSCULAR | Status: DC | PRN
  Administered 2019-03-18: 4 mg via INTRAVENOUS

## 2019-03-18 MED ORDER — FAMOTIDINE IN NACL 20-0.9 MG/50ML-% IV SOLN
20.0000 mg | Freq: Once | INTRAVENOUS | Status: AC
  Administered 2019-03-18: 20 mg via INTRAVENOUS
  Filled 2019-03-18: qty 50

## 2019-03-18 MED ORDER — ONDANSETRON HCL 4 MG/2ML IJ SOLN
INTRAMUSCULAR | Status: AC
  Filled 2019-03-18: qty 2

## 2019-03-18 MED ORDER — DIPHENHYDRAMINE HCL 25 MG PO CAPS: 25.0000 mg | ORAL_CAPSULE | ORAL | Status: DC | PRN

## 2019-03-18 MED ORDER — ONDANSETRON HCL 4 MG/2ML IJ SOLN: 4.0000 mg | Freq: Three times a day (TID) | INTRAMUSCULAR | Status: DC | PRN

## 2019-03-18 MED ORDER — LACTATED RINGERS IV BOLUS
1000.0000 mL | Freq: Once | INTRAVENOUS | Status: AC
  Administered 2019-03-18: 1000 mL via INTRAVENOUS

## 2019-03-18 MED ORDER — PHENYLEPHRINE HCL-NACL 20-0.9 MG/250ML-% IV SOLN
INTRAVENOUS | Status: AC
  Filled 2019-03-18: qty 250

## 2019-03-18 MED ORDER — BUPIVACAINE IN DEXTROSE 0.75-8.25 % IT SOLN
INTRATHECAL | Status: DC | PRN
  Administered 2019-03-18: 1.5 mL via INTRATHECAL

## 2019-03-18 MED ORDER — LACTATED RINGERS IV SOLN
INTRAVENOUS | Status: DC
  Administered 2019-03-18: 10:00:00 via INTRAVENOUS

## 2019-03-18 MED ORDER — SENNOSIDES-DOCUSATE SODIUM 8.6-50 MG PO TABS
2.0000 | ORAL_TABLET | ORAL | Status: DC
  Administered 2019-03-19: 2 via ORAL
  Filled 2019-03-18: qty 2

## 2019-03-18 MED ORDER — SODIUM CHLORIDE 0.9 % IV SOLN
INTRAVENOUS | Status: AC
  Filled 2019-03-18: qty 2

## 2019-03-18 MED ORDER — NALOXONE HCL 4 MG/10ML IJ SOLN
1.0000 ug/kg/h | INTRAVENOUS | Status: DC | PRN
  Filled 2019-03-18: qty 5

## 2019-03-18 MED ORDER — DIPHENHYDRAMINE HCL 25 MG PO CAPS: 25.0000 mg | ORAL_CAPSULE | Freq: Four times a day (QID) | ORAL | Status: DC | PRN

## 2019-03-18 MED ORDER — LACTATED RINGERS IV SOLN
INTRAVENOUS | Status: DC | PRN
  Administered 2019-03-18: 03:00:00 via INTRAVENOUS

## 2019-03-18 MED ORDER — SCOPOLAMINE 1 MG/3DAYS TD PT72: 1.0000 | MEDICATED_PATCH | Freq: Once | TRANSDERMAL | Status: DC

## 2019-03-18 MED ORDER — PHENYLEPHRINE HCL-NACL 20-0.9 MG/250ML-% IV SOLN
INTRAVENOUS | Status: DC | PRN
  Administered 2019-03-18: 30 ug/min via INTRAVENOUS

## 2019-03-18 MED ORDER — DIPHENHYDRAMINE HCL 50 MG/ML IJ SOLN: 12.5000 mg | INTRAMUSCULAR | Status: DC | PRN

## 2019-03-18 MED ORDER — NALOXONE HCL 0.4 MG/ML IJ SOLN: 0.4000 mg | INTRAMUSCULAR | Status: DC | PRN

## 2019-03-18 MED ORDER — NALBUPHINE HCL 10 MG/ML IJ SOLN: 5.0000 mg | Freq: Once | INTRAMUSCULAR | Status: DC | PRN

## 2019-03-18 MED ORDER — TETANUS-DIPHTH-ACELL PERTUSSIS 5-2.5-18.5 LF-MCG/0.5 IM SUSP: 0.5000 mL | Freq: Once | INTRAMUSCULAR | Status: DC

## 2019-03-18 MED ORDER — FENTANYL CITRATE (PF) 100 MCG/2ML IJ SOLN
INTRAMUSCULAR | Status: AC
  Filled 2019-03-18: qty 2

## 2019-03-18 MED ORDER — ZOLPIDEM TARTRATE 5 MG PO TABS: 5.0000 mg | ORAL_TABLET | Freq: Every evening | ORAL | Status: DC | PRN

## 2019-03-18 MED ORDER — ACETAMINOPHEN 325 MG PO TABS
650.0000 mg | ORAL_TABLET | ORAL | Status: DC | PRN
  Administered 2019-03-19: 650 mg via ORAL
  Filled 2019-03-18 (×2): qty 2

## 2019-03-18 MED ORDER — ONDANSETRON HCL 4 MG/2ML IJ SOLN
INTRAMUSCULAR | Status: DC | PRN
  Administered 2019-03-18: 4 mg via INTRAVENOUS

## 2019-03-18 MED ORDER — SODIUM CHLORIDE 0.9 % IV SOLN
INTRAVENOUS | Status: DC | PRN
  Administered 2019-03-18: 2 g via INTRAVENOUS

## 2019-03-18 MED ORDER — DIBUCAINE (PERIANAL) 1 % EX OINT: 1.0000 "application " | TOPICAL_OINTMENT | CUTANEOUS | Status: DC | PRN

## 2019-03-18 MED ORDER — MORPHINE SULFATE (PF) 0.5 MG/ML IJ SOLN
INTRAMUSCULAR | Status: AC
  Filled 2019-03-18: qty 10

## 2019-03-18 MED ORDER — SODIUM CHLORIDE 0.9% FLUSH: 3.0000 mL | INTRAVENOUS | Status: DC | PRN

## 2019-03-18 MED ORDER — OXYTOCIN 40 UNITS IN NORMAL SALINE INFUSION - SIMPLE MED
INTRAVENOUS | Status: AC
  Filled 2019-03-18: qty 1000

## 2019-03-18 MED ORDER — SIMETHICONE 80 MG PO CHEW
80.0000 mg | CHEWABLE_TABLET | ORAL | Status: DC
  Administered 2019-03-19: 80 mg via ORAL
  Filled 2019-03-18: qty 1

## 2019-03-18 SURGICAL SUPPLY — 27 items
CHLORAPREP W/TINT 26ML (MISCELLANEOUS) ×2 IMPLANT
CLAMP CORD UMBIL (MISCELLANEOUS) IMPLANT
CLOTH BEACON ORANGE TIMEOUT ST (SAFETY) ×2 IMPLANT
DRSG OPSITE POSTOP 4X10 (GAUZE/BANDAGES/DRESSINGS) ×2 IMPLANT
ELECT REM PT RETURN 9FT ADLT (ELECTROSURGICAL) ×2
ELECTRODE REM PT RTRN 9FT ADLT (ELECTROSURGICAL) ×1 IMPLANT
EXTRACTOR VACUUM M CUP 4 TUBE (SUCTIONS) IMPLANT
GLOVE BIOGEL PI IND STRL 7.0 (GLOVE) ×1 IMPLANT
GLOVE BIOGEL PI INDICATOR 7.0 (GLOVE) ×1
GLOVE SURG ORTHO 8.0 STRL STRW (GLOVE) ×2 IMPLANT
GOWN STRL REUS W/TWL LRG LVL3 (GOWN DISPOSABLE) ×4 IMPLANT
KIT ABG SYR 3ML LUER SLIP (SYRINGE) ×2 IMPLANT
NEEDLE HYPO 25X5/8 SAFETYGLIDE (NEEDLE) ×2 IMPLANT
NS IRRIG 1000ML POUR BTL (IV SOLUTION) ×2 IMPLANT
PACK C SECTION WH (CUSTOM PROCEDURE TRAY) ×2 IMPLANT
PAD OB MATERNITY 4.3X12.25 (PERSONAL CARE ITEMS) ×2 IMPLANT
PENCIL SMOKE EVAC W/HOLSTER (ELECTROSURGICAL) ×2 IMPLANT
SUT MNCRL 0 VIOLET CTX 36 (SUTURE) ×3 IMPLANT
SUT MON AB 4-0 PS1 27 (SUTURE) ×2 IMPLANT
SUT MONOCRYL 0 CTX 36 (SUTURE) ×3
SUT PDS AB 1 CT  36 (SUTURE)
SUT PDS AB 1 CT 36 (SUTURE) IMPLANT
SUT VIC AB 1 CTX 36 (SUTURE)
SUT VIC AB 1 CTX36XBRD ANBCTRL (SUTURE) IMPLANT
TOWEL OR 17X24 6PK STRL BLUE (TOWEL DISPOSABLE) ×2 IMPLANT
TRAY FOLEY W/BAG SLVR 14FR LF (SET/KITS/TRAYS/PACK) ×2 IMPLANT
WATER STERILE IRR 1000ML POUR (IV SOLUTION) ×2 IMPLANT

## 2019-03-18 NOTE — Op Note (Signed)
Cesarean Section Procedure Note  Pre-operative Diagnosis: IUP at 38 3/7, Prev c/s for repeat, SROM and labor  Post-operative Diagnosis: same  Surgeon: [name]   Assistants: none  Anesthesia: spinal  Procedure:  Low Segment Transverse cesarean section  Procedure Details  The patient was seen in the Holding Room. The risks, benefits, complications, treatment options, and expected outcomes were discussed with the patient.  The patient concurred with the proposed plan, giving informed consent.  The site of surgery properly noted/marked.. A Time Out was held and the above information confirmed.  After induction of anesthesia, the patient was draped and prepped in the usual sterile manner. A Pfannenstiel incision was made and carried down through the subcutaneous tissue to the fascia. Fascial incision was made and extended transversely. The fascia was separated from the underlying rectus tissue superiorly and inferiorly. The peritoneum was identified and entered. Peritoneal incision was extended longitudinally. The utero-vesical peritoneal reflection was incised transversely and the bladder flap was bluntly freed from the lower uterine segment. A low transverse uterine incision was made. Delivered from vertex presentation was a baby with Apgar scores of 9 at one minute and 10 at five minutes. After the umbilical cord was clamped and cut cord blood was obtained for evaluation. The placenta was removed intact and appeared normal. The uterine outline, tubes and ovaries appeared normal. The uterine incision was closed with running locked sutures of 0 monocryl and imbricated with 0 monocryl. Hemostasis was observed. Lavage was carried out until clear. The peritoneum was then closed with 0 monocryl and rectus muscles plicated in the midline.  After hemostasis was assured, the fascia was then reapproximated with running sutures of 0 Vicryl. Irrigation was applied and after adequate hemostasis was assured,  the skin was reapproximated with subcutaneous sutures using 4-0 monocryl.  Instrument, sponge, and needle counts were correct prior the abdominal closure and at the conclusion of the case. The patient received 2 grams cefotetan preoperatively.  Findings: Viable female  Estimated Blood Loss:  405cc         Specimens: Placenta was sent to labor and delivery         Complications:  None

## 2019-03-18 NOTE — H&P (Signed)
Charlotte Hodge is a 33 y.o. female presenting for SROM and labor sxs.  Previous C/S for repeat.  Pregnancy uncomplicated.  GBS+. OB History    Gravida  2   Para  1   Term  1   Preterm      AB      Living  1     SAB      TAB      Ectopic      Multiple  0   Live Births             Past Medical History:  Diagnosis Date  . Gestational diabetes   . Gestational diabetes mellitus (GDM), antepartum   . GI problem    Past Surgical History:  Procedure Laterality Date  . CESAREAN SECTION N/A 05/12/2015   Procedure: CESAREAN SECTION;  Surgeon: Dian Queen, MD;  Location: [facility];  Service: Obstetrics;  Laterality: N/A;  . none    . WISDOM TOOTH EXTRACTION     Family History: family history includes Heart disease in her paternal grandfather; Hypertension in her maternal grandmother. Social History:  reports that she has never smoked. She has never used smokeless tobacco. She reports that she does not drink alcohol or use drugs.     Maternal Diabetes: No Genetic Screening: Normal Maternal Ultrasounds/Referrals: Normal Fetal Ultrasounds or other Referrals:  None Maternal Substance Abuse:  No Significant Maternal Medications:  None Significant Maternal Lab Results:  Group B Strep positive Other Comments:  None  ROS History   Blood pressure 129/79, pulse 93, temperature 98.3 F (36.8 C), resp. rate 16, height 5\' 4"  (1.626 m), weight 79.2 kg, last menstrual period 06/22/2018, unknown if currently breastfeeding. Exam Physical Exam  Prenatal labs: ABO, Rh: --/--/A POS, A POS Performed at [facility], [place] [address] [place], [place] [address]  [phone]) Antibody: NEG (12/06 0135) Rubella: Immune (05/11 0000) RPR: Nonreactive (05/11 0000)  HBsAg: Negative (05/11 0000)  HIV: Non-reactive (05/11 0000)  GBS:     Assessment/Plan: IUP at 38 3/7 week SROM and labor Prev C/S for repeat  Risks and benefits of C/S were discussed.  All questions were  answered and informed consent was obtained.  Plan to proceed with low segment transverse Cesarean Section.This patient has been seen and examined.   All of her questions were answered.  Labs and vital signs reviewed.  Informed consent has been obtained.  The History and Physical is current.   [name] 03/18/2019, 3:02 AM

## 2019-03-18 NOTE — MAU Note (Signed)
Pt states water broke 30 mins prior to arrival, clear fluid. Contractions every few minutes, rates 7-8/10. Reports good fetal movement. Cervix 1cm last week. Scheduled for RCS on 12/10.

## 2019-03-18 NOTE — Anesthesia Procedure Notes (Addendum)
Spinal  Patient location during procedure: OB Start time: 03/18/2019 3:16 AM End time: 03/18/2019 3:21 AM Staffing Anesthesiologist: Barnet Glasgow, MD Performed: anesthesiologist  Preanesthetic Checklist Completed: patient identified, surgical consent, pre-op evaluation, timeout performed, IV checked, risks and benefits discussed and monitors and equipment checked Spinal Block Patient position: sitting Prep: site prepped and draped and DuraPrep Patient monitoring: heart rate, cardiac monitor, continuous pulse ox and blood pressure Approach: midline Location: L3-4 Injection technique: single-shot Needle Needle type: Pencan  Needle gauge: 24 G Needle length: 10 cm Needle insertion depth: 5 cm Assessment Sensory level: T4 Additional Notes  1 Attempt (s). Pt tolerated procedure well.

## 2019-03-18 NOTE — Anesthesia Preprocedure Evaluation (Addendum)
Anesthesia Evaluation  Patient identified by MRN, date of birth, ID band  Reviewed: Allergy & Precautions, NPO status , Patient's Chart, lab work & pertinent test results  Airway Mallampati: II  TM Distance: >3 FB Neck ROM: Full    Dental no notable dental hx. (+) Teeth Intact   Pulmonary neg pulmonary ROS,    Pulmonary exam normal breath sounds clear to auscultation       Cardiovascular Exercise Tolerance: Good Normal cardiovascular exam Rhythm:Regular Rate:Normal     Neuro/Psych negative neurological ROS  negative psych ROS   GI/Hepatic negative GI ROS, Neg liver ROS,   Endo/Other  diabetes, Gestational  Renal/GU negative Renal ROS     Musculoskeletal   Abdominal   Peds  Hematology Hgb 13.3 Plt 265  T&S available   Anesthesia Other Findings   Reproductive/Obstetrics (+) Pregnancy                           Anesthesia Physical Anesthesia Plan  ASA: II and emergent  Anesthesia Plan: Spinal   Post-op Pain Management:    Induction:   PONV Risk Score and Plan: 3 and Treatment may vary due to age or medical condition, Dexamethasone and Ondansetron  Airway Management Planned: Nasal Cannula and Natural Airway  Additional Equipment: None  Intra-op Plan:   Post-operative Plan:   Informed Consent: I have reviewed the patients History and Physical, chart, labs and discussed the procedure including the risks, benefits and alternatives for the proposed anesthesia with the patient or authorized representative who has indicated his/her understanding and acceptance.       Plan Discussed with: CRNA  Anesthesia Plan Comments: (38 3/7 wk G2P1 for repeat C/S)       Anesthesia Quick Evaluation

## 2019-03-18 NOTE — Plan of Care (Signed)
  Problem: Education: Goal: Knowledge of condition will improve Outcome: Completed/Met

## 2019-03-18 NOTE — Transfer of Care (Signed)
Immediate Anesthesia Transfer of Care Note  Patient: Charlotte Hodge  Procedure(s) Performed: CESAREAN SECTION (N/A )  Patient Location: PACU  Anesthesia Type:Spinal  Level of Consciousness: awake, alert  and patient cooperative  Airway & Oxygen Therapy: Patient Spontanous Breathing  Post-op Assessment: Report given to RN and Post -op Vital signs reviewed and stable  Post vital signs: Reviewed and stable  Last Vitals:  Vitals Value Taken Time  BP    Temp    Pulse 82 03/18/19 0421  Resp 18 03/18/19 0421  SpO2 99 % 03/18/19 0421  Vitals shown include unvalidated device data.  Last Pain:  Vitals:   03/18/19 0106  PainSc: 8          Complications: No apparent anesthesia complications

## 2019-03-18 NOTE — Anesthesia Postprocedure Evaluation (Signed)
Anesthesia Post Note  Patient: Event organiser  Procedure(s) Performed: CESAREAN SECTION (N/A )     Patient location during evaluation: Mother Baby Anesthesia Type: Spinal Level of consciousness: oriented and awake and alert Pain management: pain level controlled Vital Signs Assessment: post-procedure vital signs reviewed and stable Respiratory status: spontaneous breathing and respiratory function stable Cardiovascular status: blood pressure returned to baseline and stable Postop Assessment: no headache, no backache, no apparent nausea or vomiting and able to ambulate Anesthetic complications: no    Last Vitals:  Vitals:   03/18/19 0445 03/18/19 0500  BP: 114/74 120/87  Pulse: 80 86  Resp: 15 (!) 21  Temp:    SpO2: 98% 97%    Last Pain:  Vitals:   03/18/19 0500  PainSc: 2    Pain Goal:                Epidural/Spinal Function Cutaneous sensation: Able to Wiggle Toes (03/18/19 0500), Patient able to flex knees: Yes (03/18/19 0500), Patient able to lift hips off bed: Yes (03/18/19 0500), Back pain beyond tenderness at insertion site: No (03/18/19 0500), Progressively worsening motor and/or sensory loss: No (03/18/19 0500), Bowel and/or bladder incontinence post epidural: No (03/18/19 0500)  Barnet Glasgow

## 2019-03-18 NOTE — Lactation Note (Signed)
This note was copied from a baby's chart. 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  Mom breastfed her first baby for 2.5 years.  Newborn is 7 hours old and has been to breast 4 times.  Mom reports baby is latching easily and feeding is comfortable.  She has hand expressed colostrum.  Instructed to feed with any cue and call for assist prn.  Breastfeeding services information given and reviewed.  Mom denies questions or concerns.  Maternal Data Has patient been taught Hand Expression?: Yes Does the patient have breastfeeding experience prior to this delivery?: Yes  Feeding Feeding Type: Breast Fed  LATCH Score Latch: Grasps breast easily, tongue down, lips flanged, rhythmical sucking.  Audible Swallowing: A few with stimulation  Type of Nipple: Everted at rest and after stimulation  Comfort (Breast/Nipple): Soft / non-tender  Hold (Positioning): No assistance needed to correctly position infant at breast.  LATCH Score: 9  Interventions Interventions: Breast feeding basics reviewed  Lactation Tools Discussed/Used     Consult Status Consult Status: Follow-up Date: 03/19/19 Follow-up type: In-patient    Ave Filter 03/18/2019, 10:54 AM

## 2019-03-19 MED ORDER — IBUPROFEN 800 MG PO TABS: 800.0000 mg | ORAL_TABLET | Freq: Three times a day (TID) | ORAL | 0 refills | Status: AC

## 2019-03-19 NOTE — Progress Notes (Signed)
MOB was referred for history of depression/anxiety.  * Referral screened out by Clinical Social Worker because none of the following criteria appear to apply:  ~ History of anxiety/depression during this pregnancy, or of post-partum depression following prior delivery. ~ Diagnosis of anxiety and/or depression within last 3 years. MOB seen by CSW in 2017 where it was reported anxiety dates back to childhood. No new concerns noted in [facility] records. OR * MOB's symptoms currently being treated with medication and/or therapy.  Please contact the Clinical Social Worker if needs arise or by MOB request.  Lana Flaim, LCSWA  [facility] [phone]  

## 2019-03-19 NOTE — Discharge Summary (Signed)
Obstetric Discharge Summary Reason for Admission: rupture of membranes Prenatal Procedures: none Intrapartum Procedures: cesarean: low cervical, transverse Postpartum Procedures: none Complications-Operative and Postpartum: none Hemoglobin  Date Value Ref Range Status  03/18/2019 11.8 (L) 12.0 - 15.0 g/dL Final   HCT  Date Value Ref Range Status  03/18/2019 34.6 (L) 36.0 - 46.0 % Final    Physical Exam:  General: alert, cooperative and appears older than stated age 33: appropriate Uterine Fundus: firm Incision: healing well, no significant drainage, no dehiscence, no significant erythema DVT Evaluation: No evidence of DVT seen on physical exam.  Discharge Diagnoses: Term Pregnancy-delivered  Discharge Information: Date: 03/19/2019 Activity: pelvic rest Diet: routine Medications: Ibuprofen Condition: improved Instructions: refer to practice specific booklet Discharge to: home   Newborn Data: Live born female  Birth Weight: 7 lb 4.9 oz (3314 g) APGAR: 80, 10  Newborn Delivery   Birth date/time: 03/18/2019 03:40:00 Delivery type: C-Section, Low Transverse Trial of labor: No C-section categorization: Repeat      Home with mother.  Cyril Mourning 03/19/2019, 8:43 AM

## 2019-03-19 NOTE — Lactation Note (Signed)
This note was copied from a baby's chart. Lactation Consultation Note  Patient Name: Charlotte Hodge [id number] Date: 03/19/2019 Reason for consult: Follow-up assessment;Early term 37-38.6wks;Infant weight loss;Other (Comment)(exp BF greater than 2 years) Type of Endocrine Disorder?: Diabetes  Baby is 36 hours old  Baby awake after Dr. Jasmine December and mom latched with LS of 10.  Swallows noted. Mom pointed out baby's recessed chin and LC  Recommended working on the cross cradle and switching her arms to the  Cradle position once  the depth obtained and swallows or alterate football.  Mom mentioned her 1st baby had more of a recessed chin.  LC reinforced the positioning and depth is important.  LC reviewed sore nipple and engorgement prevention and tx.  Mom as a DEBP Spectra at home. Storage of breast milk reviewed.  LC instructed mom on the use of a hand pump and per mom used a #24 F with her 1st one and didn't need a larger one.     Maternal Data    Feeding Feeding Type: Breast Fed  LATCH Score Latch: Grasps breast easily, tongue down, lips flanged, rhythmical sucking.  Audible Swallowing: Spontaneous and intermittent  Type of Nipple: Everted at rest and after stimulation  Comfort (Breast/Nipple): Soft / non-tender  Hold (Positioning): No assistance needed to correctly position infant at breast.  LATCH Score: 10  Interventions Interventions: Breast feeding basics reviewed;Assisted with latch;Skin to skin;Adjust position;Support pillows;Position options  Lactation Tools Discussed/Used Tools: Pump;Flanges Flange Size: 24 Breast pump type: Manual WIC Program: No Pump Review: Milk Storage;Setup, frequency, and cleaning Initiated by:: MAI Date initiated:: 03/19/19   Consult Status Consult Status: Complete Date: 03/19/19    Myer Haff 03/19/2019, 10:33 AM

## 2019-03-20 ENCOUNTER — Other Ambulatory Visit (HOSPITAL_COMMUNITY)
Admission: RE | Admit: 2019-03-20 | Discharge: 2019-03-20 | Disposition: A | Discharge status: Home / Self Care | Payer: BLUE CROSS/BLUE SHIELD | Source: Ambulatory Visit | Attending: Obstetrics and Gynecology | Admitting: Obstetrics and Gynecology

## 2019-03-22 ENCOUNTER — Inpatient Hospital Stay (HOSPITAL_COMMUNITY)
Admission: RE | Admit: 2019-03-22 | Payer: BLUE CROSS/BLUE SHIELD | Source: Home / Self Care | Admitting: Obstetrics and Gynecology

## 2019-05-04 ENCOUNTER — Other Ambulatory Visit: Payer: Self-pay | Admitting: Obstetrics and Gynecology

## 2019-05-04 DIAGNOSIS — R2232 Localized swelling, mass and lump, left upper limb: Secondary | ICD-10-CM

## 2019-05-17 ENCOUNTER — Other Ambulatory Visit: Payer: Self-pay | Admitting: Obstetrics and Gynecology

## 2019-05-17 ENCOUNTER — Ambulatory Visit
Admission: RE | Admit: 2019-05-17 | Discharge: 2019-05-17 | Disposition: A | Discharge status: Home / Self Care | Payer: Self-pay | Source: Ambulatory Visit | Attending: Obstetrics and Gynecology | Admitting: Obstetrics and Gynecology

## 2019-05-17 ENCOUNTER — Other Ambulatory Visit: Payer: Self-pay

## 2019-05-17 DIAGNOSIS — R2232 Localized swelling, mass and lump, left upper limb: Secondary | ICD-10-CM

## 2019-05-22 ENCOUNTER — Other Ambulatory Visit: Payer: Self-pay

## 2019-08-20 ENCOUNTER — Other Ambulatory Visit: Payer: Self-pay

## 2019-08-20 ENCOUNTER — Ambulatory Visit
Admission: RE | Admit: 2019-08-20 | Discharge: 2019-08-20 | Disposition: A | Discharge status: Home / Self Care | Payer: Self-pay | Source: Ambulatory Visit | Attending: Obstetrics and Gynecology | Admitting: Obstetrics and Gynecology

## 2019-08-20 DIAGNOSIS — R2232 Localized swelling, mass and lump, left upper limb: Secondary | ICD-10-CM

## 2020-12-15 IMAGING — MG DIGITAL DIAGNOSTIC BILAT W/ TOMO W/ CAD
5 of 10 series · 5 of 30 positions shown · non-contrast
Comparison: None

CLINICAL DATA: 33-year-old breast feeding patient noticed a
thickness/lump in the left axilla on 04/06/2019. She states that her
axilla have always been somewhat thick, but she has noticed an
asymmetry N Carrasco area in the mid left axilla. She believes that the
palpable area of concern in the left axilla has decreased some since
she first noticed it, but has not resolved. She does not have any
pain in the left axilla. She denies any known history of cyclical
menstrual pain in the axillary regions. She did not experience this
area of axillary concern during or after her first pregnancy.

EXAM:
DIGITAL DIAGNOSTIC BILATERAL MAMMOGRAM WITH CAD AND TOMO
ULTRASOUND LEFT AXILLA

[R MLO synth-2D]
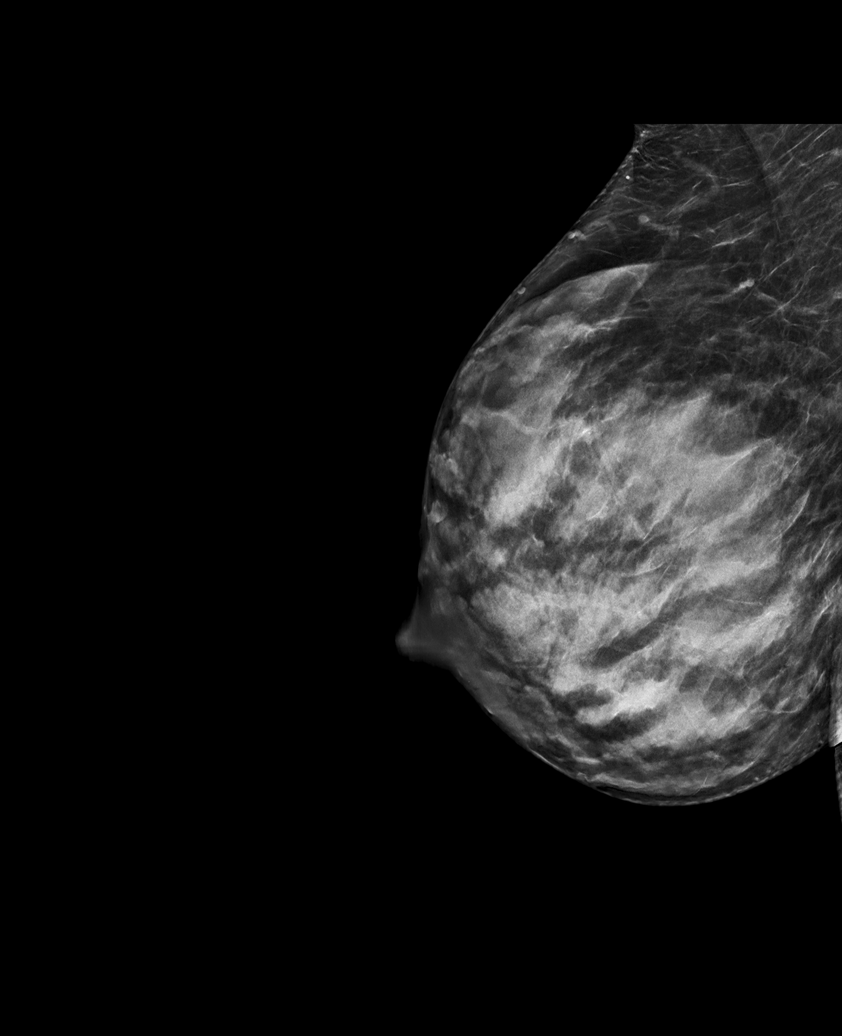

[L TAN synth-2D]
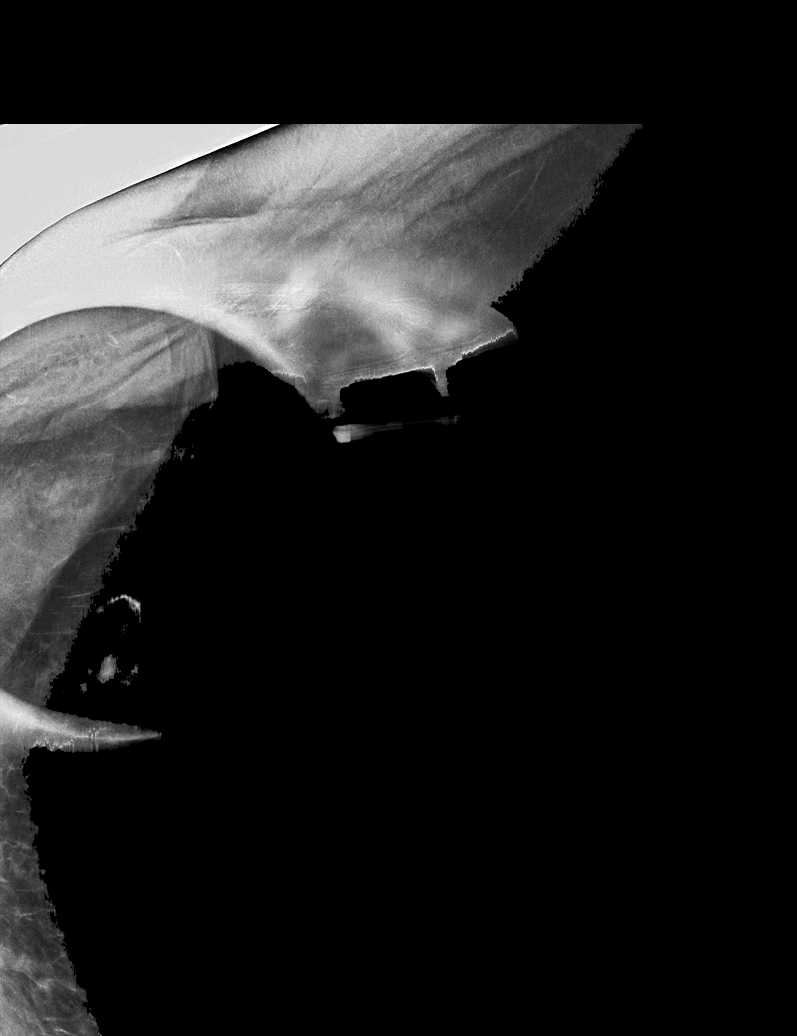

[L CC synth-2D]
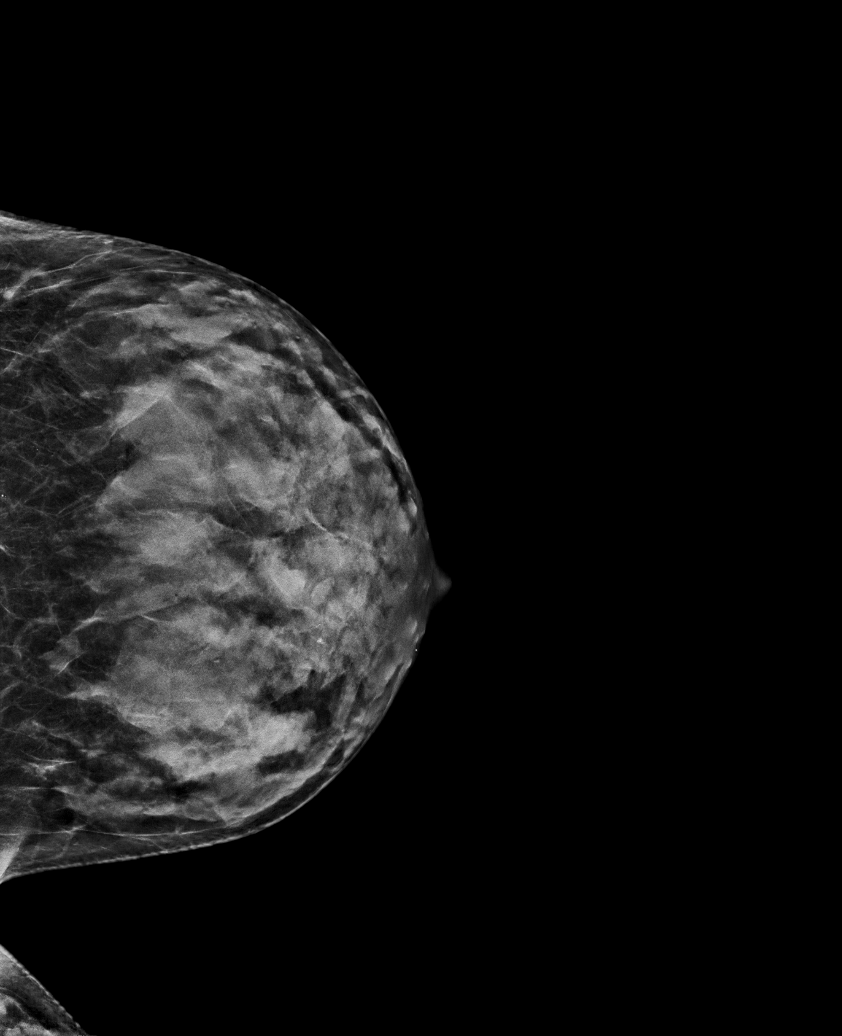

[R CC synth-2D]
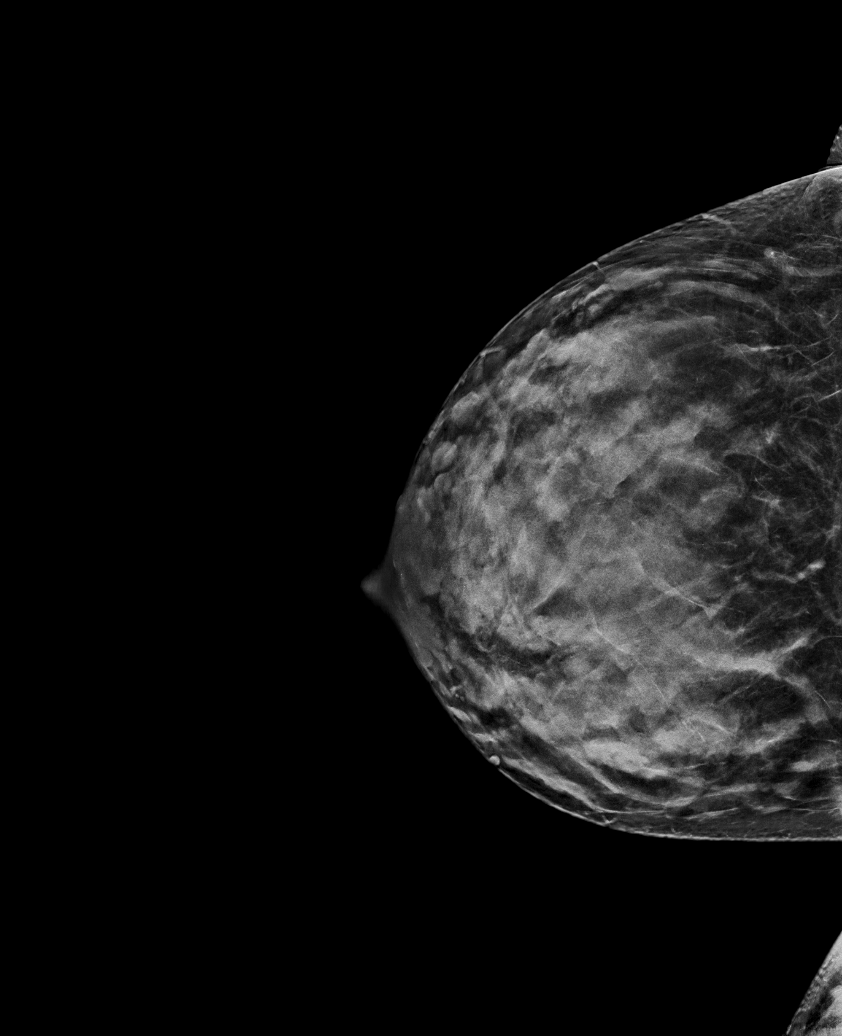

[L MLO synth-2D]
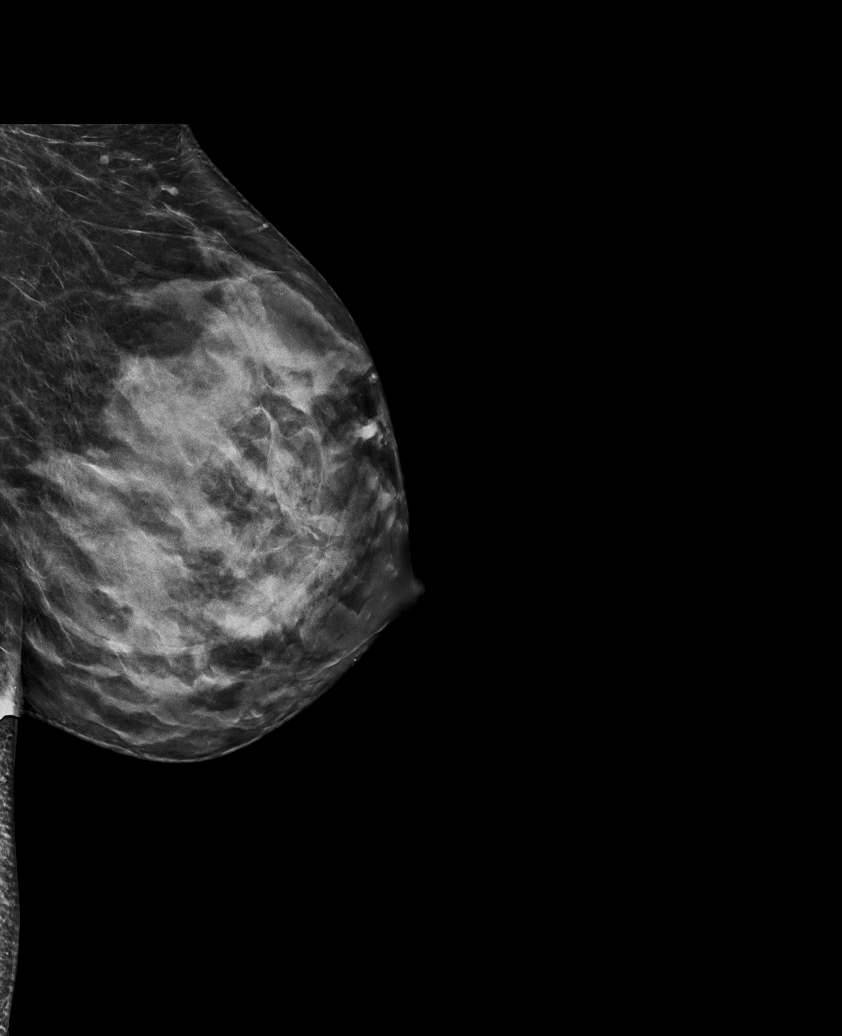

[5 of 30 positions shown; findings below may reference images not displayed]

ACR Breast Density Category d: The breast tissue is extremely dense,
which lowers the sensitivity of mammography.
FINDINGS: The breast parenchyma has a typical appearance for lactating breast
parenchyma. No mass, distortion, or suspicious microcalcification is
identified. The imaged portions of the axillary regions appear
symmetric and within normal limits; please note that the entire
axillary regions are not included on the mammogram. A spot
tangential view of the left axilla in the region of patient concern
shows no lymphadenopathy.

Mammographic images were processed with CAD.

On physical exam, I do not definitely detect an asymmetry between
the left axilla and right axilla on palpation. The skin of the left
axilla appears normal. I do not palpate lymphadenopathy.

Targeted ultrasound is performed, showing an axillary lymph node
measuring 0.7 x 0.9 x 0.6 cm with an approximately 2.5 mm cortex and
normal fatty hilum. No enlarged lymph nodes are identified in the
left axilla.

In the general region of palpable concern is an oblong/slightly
tubular mass immediately subjacent to the skin that contains fluid
and some internal echoes isoechoic to fat lobules. This mass spans
1.3 x 0.3 x 0.4 cm. There is no internal vascular flow. It is
uncertain to me if this definitely accounts for the patient's
palpable area of concern. Overlying skin thickness is normal.
IMPRESSION: In the left axilla is a benign appearing tubular fluid collection
with internal echoes. In the setting of a lactating patient, this
raises the question of a small galactocele or dilated duct
containing milk. There is no associated tenderness.

A lymph node in the region of patient concern is within normal
limits for size.

No mammographic evidence of malignancy in either breast.

RECOMMENDATION:
A 3 month follow-up left axillary ultrasound is recommended.

I have discussed the findings and recommendations with the patient.
If applicable, a reminder letter will be sent to the patient
regarding the next appointment.

BI-RADS CATEGORY  3: Probably benign.

## 2021-03-20 IMAGING — US US AXILLARY LEFT
1 series · 9 of 9 positions shown · non-contrast
Comparison: 05/17/2019

CLINICAL DATA: 34-year-old lactating patient presents for 3 month
follow-up of a palpable area in the left axilla. This was evaluated
in May 2019 with a probably appearance. Patient states that the
area definitely fluctuates in size.

EXAM:
ULTRASOUND OF THE LEFT AXILLA

[Series 1: us axillary left · 0.04mm/px · 9 of 9 slices shown]
[im 1/9]
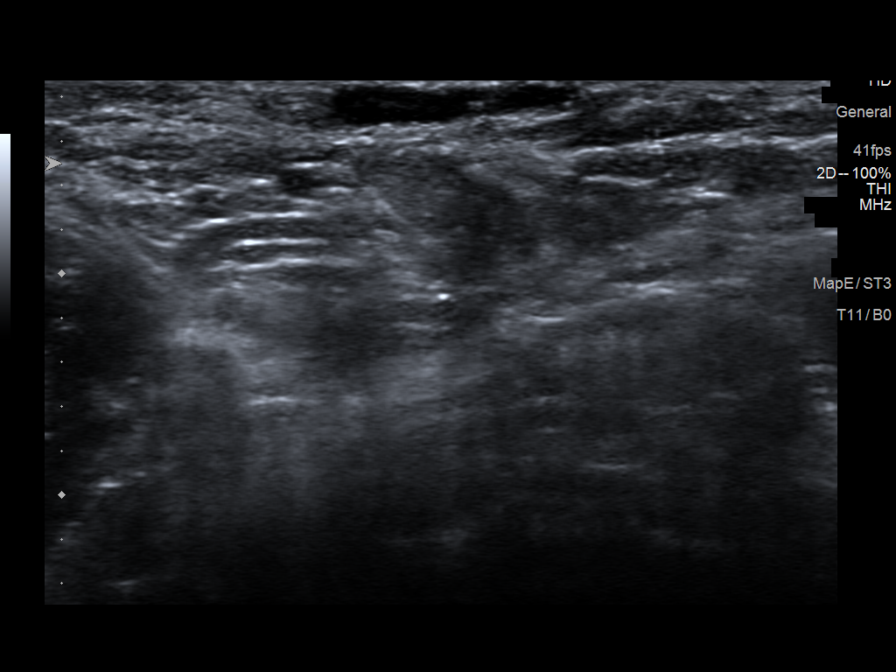
[im 2/9]
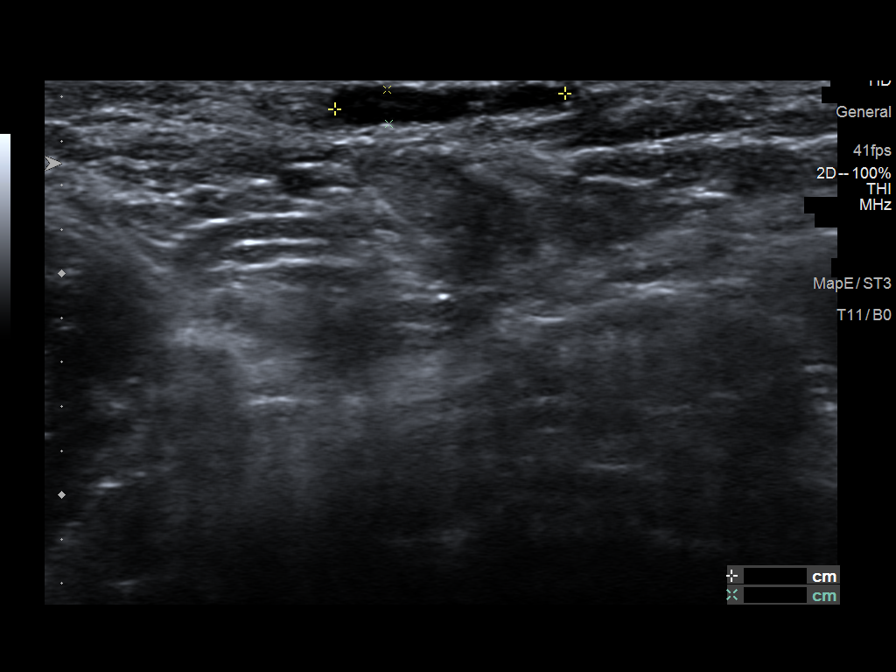
[im 3/9]
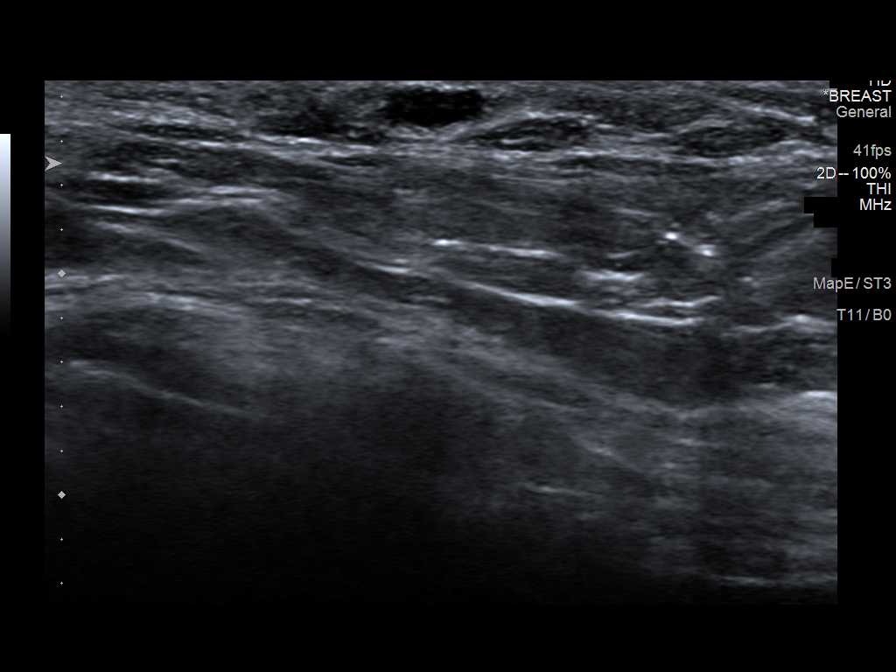
[im 4/9]
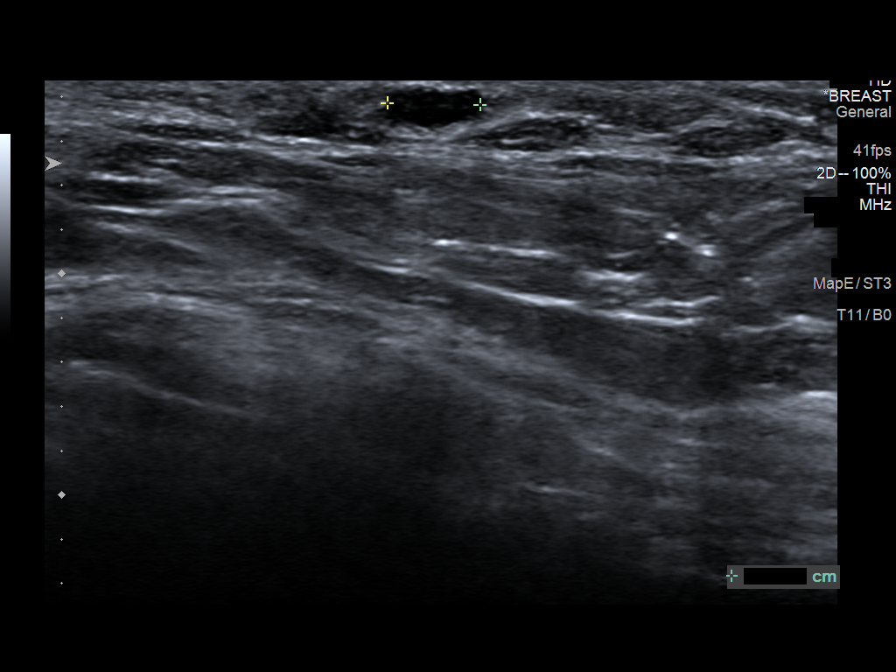
[im 5/9]
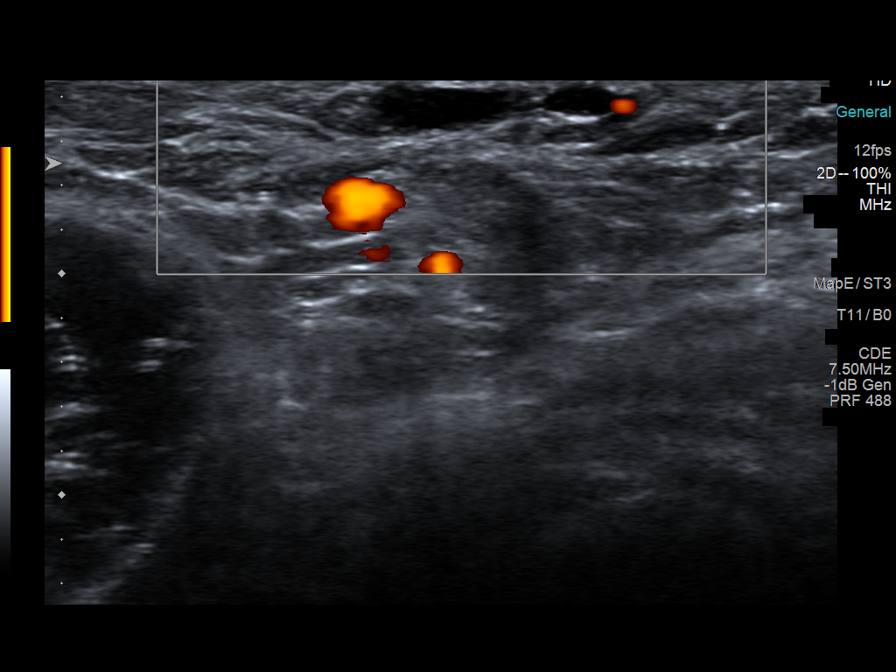
[im 6/9]
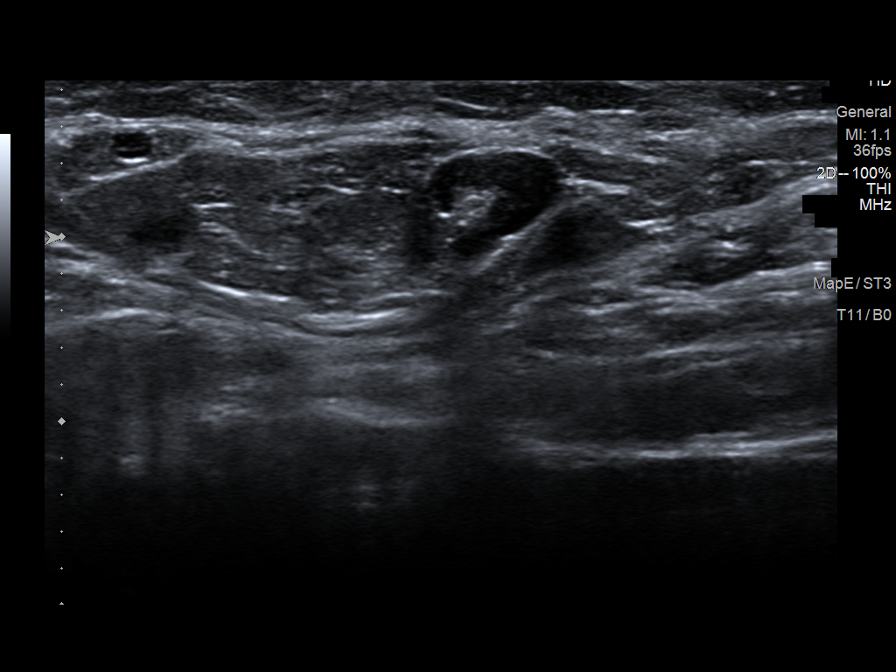
[im 7/9]
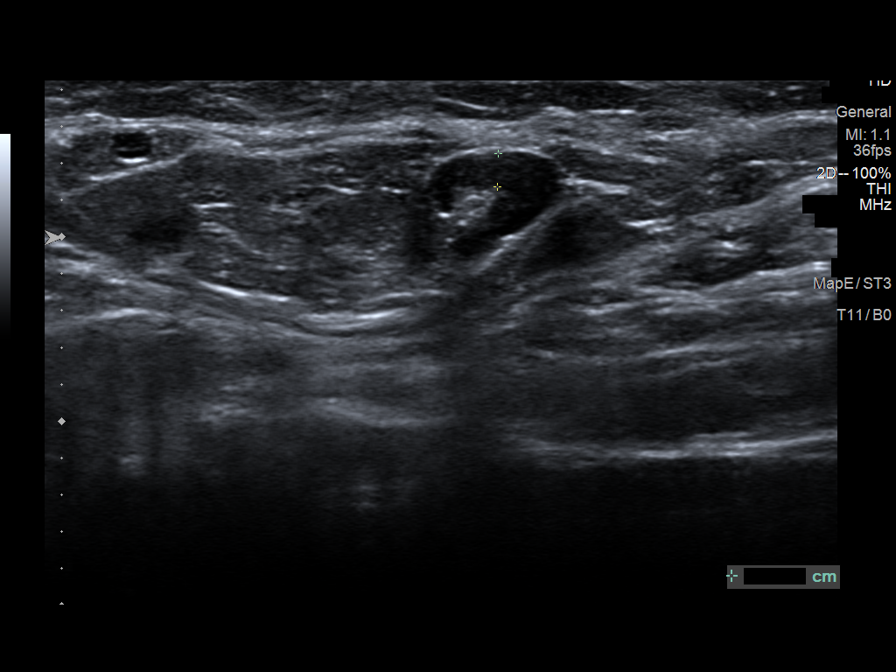
[im 8/9]
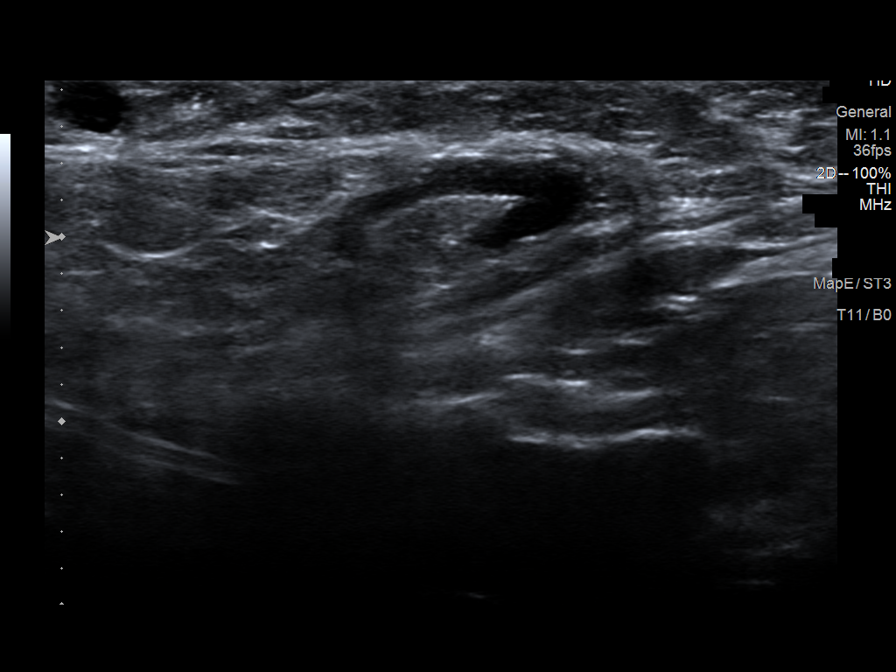
[im 9/9]
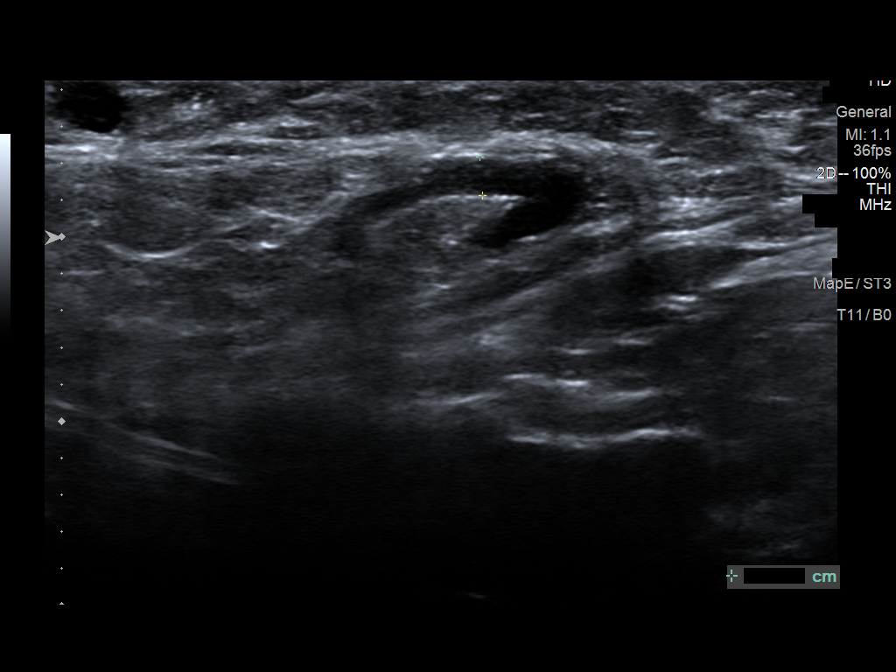

[9 of 9 positions shown; findings below may reference images not displayed]

FINDINGS: Ultrasound is performed, showing a near anechoic tubular area in the
superficial aspect of the left axilla corresponding to the palpable
lump and measuring 1.0 x 0.2 x 0.4 cm. No internal vascular flow.
Left axillary lymph nodes are normal in size and appearance.
Previously, on May 17, 2019, the dilated tubular fluid-filled
area was larger and contained echogenic areas within it, suggesting
milk in the setting of a lactating patient. On May 2019, the
tubular palpable mass measured 1.3 x 0.3 x 0.4 cm.
IMPRESSION: Interval decrease in size of the tubular fluid-filled structure in
the left axilla compared to the ultrasound of 05/17/2019. Patient
confirms that the area does fluctuate in size. Findings can be
considered benign and likely reflect a milk duct within axillary
breast parenchyma or small galactocele.

RECOMMENDATION:
Screening mammogram at age 42 unless there are persistent or
intervening clinical concerns. (Code:7E-R-CKP)

I have discussed the findings and recommendations with the patient.
If applicable, a reminder letter will be sent to the patient
regarding the next appointment.

BI-RADS CATEGORY  2: Benign.

## 2024-06-18 ENCOUNTER — Inpatient Hospital Stay (HOSPITAL_COMMUNITY): Admit: 2024-06-18 | Payer: Self-pay | Admitting: Obstetrics and Gynecology

## 2024-06-18 ENCOUNTER — Encounter (HOSPITAL_COMMUNITY): Payer: Self-pay

## 2024-06-18 SURGERY — Surgical Case
Anesthesia: Regional
